# Patient Record
Sex: Female | Born: 1935 | Race: White | Hispanic: No | Marital: Married | Smoking: Never smoker
Health system: Southern US, Community
[De-identification: ages and names within clinical notes are randomized; demographics above are authoritative.]

## PROBLEM LIST (undated history)

## (undated) DIAGNOSIS — J45909 Unspecified asthma, uncomplicated: Secondary | ICD-10-CM

## (undated) DIAGNOSIS — N182 Chronic kidney disease, stage 2 (mild): Secondary | ICD-10-CM

## (undated) DIAGNOSIS — I10 Essential (primary) hypertension: Secondary | ICD-10-CM

## (undated) DIAGNOSIS — I4891 Unspecified atrial fibrillation: Secondary | ICD-10-CM

## (undated) DIAGNOSIS — C801 Malignant (primary) neoplasm, unspecified: Secondary | ICD-10-CM

## (undated) DIAGNOSIS — M8580 Other specified disorders of bone density and structure, unspecified site: Secondary | ICD-10-CM

## (undated) DIAGNOSIS — Z9221 Personal history of antineoplastic chemotherapy: Secondary | ICD-10-CM

## (undated) DIAGNOSIS — I428 Other cardiomyopathies: Secondary | ICD-10-CM

## (undated) DIAGNOSIS — C50919 Malignant neoplasm of unspecified site of unspecified female breast: Secondary | ICD-10-CM

## (undated) DIAGNOSIS — F0390 Unspecified dementia without behavioral disturbance: Secondary | ICD-10-CM

## (undated) HISTORY — DX: Malignant (primary) neoplasm, unspecified: C80.1

## (undated) HISTORY — PX: NASAL SEPTUM SURGERY: SHX37

## (undated) HISTORY — DX: Other specified disorders of bone density and structure, unspecified site: M85.80

## (undated) HISTORY — DX: Unspecified asthma, uncomplicated: J45.909

## (undated) HISTORY — PX: BREAST SURGERY: SHX581

## (undated) HISTORY — DX: Essential (primary) hypertension: I10

## (undated) HISTORY — PX: AUGMENTATION MAMMAPLASTY: SUR837

## (undated) HISTORY — PX: APPENDECTOMY: SHX54

---

## 1994-04-05 HISTORY — PX: MASTECTOMY: SHX3

## 2004-07-21 ENCOUNTER — Other Ambulatory Visit: Admission: RE | Admit: 2004-07-21 | Discharge: 2004-07-21 | Payer: Self-pay | Admitting: Gynecology

## 2004-10-13 ENCOUNTER — Encounter: Admission: RE | Admit: 2004-10-13 | Discharge: 2004-10-13 | Payer: Self-pay | Admitting: Gynecology

## 2005-07-22 ENCOUNTER — Other Ambulatory Visit: Admission: RE | Admit: 2005-07-22 | Discharge: 2005-07-22 | Payer: Self-pay | Admitting: Gynecology

## 2005-11-02 ENCOUNTER — Encounter: Admission: RE | Admit: 2005-11-02 | Discharge: 2005-11-02 | Payer: Self-pay | Admitting: Gynecology

## 2006-07-25 ENCOUNTER — Other Ambulatory Visit: Admission: RE | Admit: 2006-07-25 | Discharge: 2006-07-25 | Payer: Self-pay | Admitting: Gynecology

## 2006-11-04 ENCOUNTER — Encounter: Admission: RE | Admit: 2006-11-04 | Discharge: 2006-11-04 | Payer: Self-pay | Admitting: Gynecology

## 2007-11-07 ENCOUNTER — Encounter: Admission: RE | Admit: 2007-11-07 | Discharge: 2007-11-07 | Payer: Self-pay | Admitting: Obstetrics and Gynecology

## 2008-07-26 ENCOUNTER — Encounter: Payer: Self-pay | Admitting: Women's Health

## 2008-07-26 ENCOUNTER — Ambulatory Visit: Payer: Self-pay | Admitting: Women's Health

## 2008-07-26 ENCOUNTER — Other Ambulatory Visit: Admission: RE | Admit: 2008-07-26 | Discharge: 2008-07-26 | Payer: Self-pay | Admitting: Obstetrics and Gynecology

## 2008-11-07 ENCOUNTER — Encounter: Admission: RE | Admit: 2008-11-07 | Discharge: 2008-11-07 | Payer: Self-pay | Admitting: Obstetrics and Gynecology

## 2009-07-31 ENCOUNTER — Ambulatory Visit: Payer: Self-pay | Admitting: Women's Health

## 2009-09-04 ENCOUNTER — Ambulatory Visit: Payer: Self-pay | Admitting: Gynecology

## 2009-11-10 ENCOUNTER — Encounter: Admission: RE | Admit: 2009-11-10 | Discharge: 2009-11-10 | Payer: Self-pay | Admitting: Obstetrics and Gynecology

## 2010-10-20 ENCOUNTER — Other Ambulatory Visit: Payer: Self-pay | Admitting: Obstetrics and Gynecology

## 2010-10-20 DIAGNOSIS — Z9010 Acquired absence of unspecified breast and nipple: Secondary | ICD-10-CM

## 2010-11-13 ENCOUNTER — Ambulatory Visit
Admission: RE | Admit: 2010-11-13 | Discharge: 2010-11-13 | Disposition: A | Discharge status: Home / Self Care | Payer: PRIVATE HEALTH INSURANCE | Source: Ambulatory Visit | Attending: Obstetrics and Gynecology | Admitting: Obstetrics and Gynecology

## 2010-11-13 DIAGNOSIS — Z9010 Acquired absence of unspecified breast and nipple: Secondary | ICD-10-CM

## 2012-01-25 ENCOUNTER — Other Ambulatory Visit: Payer: Self-pay | Admitting: Obstetrics and Gynecology

## 2012-01-25 DIAGNOSIS — Z1231 Encounter for screening mammogram for malignant neoplasm of breast: Secondary | ICD-10-CM

## 2012-02-29 ENCOUNTER — Ambulatory Visit: Payer: PRIVATE HEALTH INSURANCE

## 2012-03-07 ENCOUNTER — Encounter: Payer: Self-pay | Admitting: Gynecology

## 2012-03-07 DIAGNOSIS — J45909 Unspecified asthma, uncomplicated: Secondary | ICD-10-CM | POA: Insufficient documentation

## 2012-03-07 DIAGNOSIS — C801 Malignant (primary) neoplasm, unspecified: Secondary | ICD-10-CM | POA: Insufficient documentation

## 2012-03-07 DIAGNOSIS — I10 Essential (primary) hypertension: Secondary | ICD-10-CM | POA: Insufficient documentation

## 2012-03-07 DIAGNOSIS — M8580 Other specified disorders of bone density and structure, unspecified site: Secondary | ICD-10-CM | POA: Insufficient documentation

## 2012-03-09 ENCOUNTER — Ambulatory Visit
Admission: RE | Admit: 2012-03-09 | Discharge: 2012-03-09 | Disposition: A | Discharge status: Home / Self Care | Payer: Medicare Other | Source: Ambulatory Visit | Attending: Obstetrics and Gynecology | Admitting: Obstetrics and Gynecology

## 2012-03-09 DIAGNOSIS — Z1231 Encounter for screening mammogram for malignant neoplasm of breast: Secondary | ICD-10-CM

## 2012-03-15 ENCOUNTER — Ambulatory Visit (INDEPENDENT_AMBULATORY_CARE_PROVIDER_SITE_OTHER): Payer: Medicare Other | Admitting: Women's Health

## 2012-03-15 ENCOUNTER — Encounter: Payer: Self-pay | Admitting: Women's Health

## 2012-03-15 ENCOUNTER — Other Ambulatory Visit (HOSPITAL_COMMUNITY)
Admission: RE | Admit: 2012-03-15 | Discharge: 2012-03-15 | Disposition: A | Discharge status: Home / Self Care | Payer: Medicare Other | Source: Ambulatory Visit | Attending: Women's Health | Admitting: Women's Health

## 2012-03-15 ENCOUNTER — Encounter: Payer: PRIVATE HEALTH INSURANCE | Admitting: Obstetrics and Gynecology

## 2012-03-15 VITALS — BP 128/86 | Ht 66.0 in | Wt 121.0 lb

## 2012-03-15 DIAGNOSIS — M8580 Other specified disorders of bone density and structure, unspecified site: Secondary | ICD-10-CM

## 2012-03-15 DIAGNOSIS — Z124 Encounter for screening for malignant neoplasm of cervix: Secondary | ICD-10-CM

## 2012-03-15 DIAGNOSIS — C801 Malignant (primary) neoplasm, unspecified: Secondary | ICD-10-CM

## 2012-03-15 DIAGNOSIS — M899 Disorder of bone, unspecified: Secondary | ICD-10-CM

## 2012-03-15 DIAGNOSIS — Z1151 Encounter for screening for human papillomavirus (HPV): Secondary | ICD-10-CM | POA: Insufficient documentation

## 2012-03-15 NOTE — Patient Instructions (Addendum)

## 2012-03-15 NOTE — Progress Notes (Signed)
Monica Lyons December 29, 1932 [id number]    History:    The patient presents for breast and pelvic exam. History of left breast cancer in 1994 with mastectomy with reconstruction, chemotherapy, radiation and tamoxifen for 4 years. Osteopenic T score -1.9 a left femoral neck, bilateral hip average -1.4. FRAX 11%/2.7%. History of normal Paps and mammograms after 1994. Hypertension-labs and meds primary care. Declines colonoscopy. Has had Pneumovax.  Past medical history, past surgical history, family history and social history were all reviewed and documented in the EPIC chart. Husband had many illnesses/ disabilities and cared for him, he died 03-Feb-2012.    Exam:  Filed Vitals:   03/15/12 1524  BP: 128/86    General appearance:  Normal Head/Neck:  Normal, without cervical or supraclavicular adenopathy. Thyroid:  Symmetrical, normal in size, without palpable masses or nodularity. Respiratory  Effort:  Normal  Auscultation:  Clear without wheezing or rhonchi Cardiovascular  Auscultation:  Regular rate, without rubs, murmurs or gallops  Edema/varicosities:  Not grossly evident Abdominal  Soft,nontender, without masses, guarding or rebound.  Liver/spleen:  No organomegaly noted  Hernia:  None appreciated  Skin  Inspection:  Grossly normal  Palpation:  Grossly normal Neurologic/psychiatric  Orientation:  Normal with appropriate conversation.  Mood/affect:  Normal  Genitourinary    Breasts: Examined lying and sitting.     Right: Without masses, retractions, discharge or axillary adenopathy.     Left: Without masses, retractions, discharge or axillary adenopathy.   Inguinal/mons:  Normal without inguinal adenopathy  External genitalia:  Normal  BUS/Urethra/Skene's glands:  Normal  Bladder:  Normal  Vagina:  Atrophic   Cervix:  Normal  Uterus:   normal in size, shape and contour.  Midline and mobile  Adnexa/parametria:     Rt: Without masses or tenderness.   Lt: Without  masses or tenderness.  Anus and perineum: Normal  Digital rectal exam: Normal sphincter tone without palpated masses or tenderness  Assessment/Plan:  78 y.o. W. WF G2 P2 for breast and pelvic exam.  Left breast cancer/mastectomy/reconstruction 94 with normal mammograms after Osteopenia T score -1.9 left femoral neck 09/2009 Hypertension-primary care  Plan: Repeat DEXA, will schedule. Home safety and fall prevention discussed, calcium rich diet, vitamin D 2000 daily encouraged. Pap, last Pap normal 2010, new screening guidelines reviewed. SBE's, continue annual mammogram, normal mammogram last week. Discussed colonoscopy declines.     Harrington Challenger [facility], 5:39 PM 03/15/2012

## 2012-04-05 DIAGNOSIS — M8580 Other specified disorders of bone density and structure, unspecified site: Secondary | ICD-10-CM

## 2012-04-05 HISTORY — DX: Other specified disorders of bone density and structure, unspecified site: M85.80

## 2012-05-02 ENCOUNTER — Other Ambulatory Visit: Payer: Self-pay | Admitting: Gynecology

## 2012-05-02 ENCOUNTER — Ambulatory Visit (INDEPENDENT_AMBULATORY_CARE_PROVIDER_SITE_OTHER): Payer: Medicare Other

## 2012-05-02 DIAGNOSIS — M8580 Other specified disorders of bone density and structure, unspecified site: Secondary | ICD-10-CM

## 2012-05-02 DIAGNOSIS — M949 Disorder of cartilage, unspecified: Secondary | ICD-10-CM

## 2012-05-03 ENCOUNTER — Encounter: Payer: Self-pay | Admitting: Gynecology

## 2012-05-10 ENCOUNTER — Telehealth: Payer: Self-pay | Admitting: *Deleted

## 2012-05-10 ENCOUNTER — Encounter: Payer: Self-pay | Admitting: Gynecology

## 2012-05-10 ENCOUNTER — Ambulatory Visit (INDEPENDENT_AMBULATORY_CARE_PROVIDER_SITE_OTHER): Payer: Medicare Other | Admitting: Gynecology

## 2012-05-10 DIAGNOSIS — M8580 Other specified disorders of bone density and structure, unspecified site: Secondary | ICD-10-CM

## 2012-05-10 DIAGNOSIS — M899 Disorder of bone, unspecified: Secondary | ICD-10-CM

## 2012-05-10 MED ORDER — ALENDRONATE SODIUM 10 MG PO TABS: 10.0000 mg | ORAL_TABLET | Freq: Every day | ORAL | Status: DC

## 2012-05-10 NOTE — Telephone Encounter (Signed)
Message copied by Aura Camps on Wed May 10, 2012  3:23 PM ------      Message from: Dara Lords      Created: Wed May 10, 2012  3:00 PM       Tell patient that I prescribed the Fosamax 10 mg daily to her pharmacy

## 2012-05-10 NOTE — Progress Notes (Signed)
Patient presents to discuss her recent DEXA. T score -2.0 FRAX 31%/19%. I reviewed in detail her whole DEXA report. I discussed osteopenia and FRAX with elevated fracture risk about the 20%/3% recommended treatment thresholds. I discussed treatment options with her to include bisphosphonates Prolia Evista. She is a breast cancer survivor and does have a history of being very sensitive to new medications. I reviewed Evista and that this may address 2 issues with her history of breast cancer decreasing her risk of recurrence as well as treating the osteoporosis. She was on tamoxifen previously apparently a reaction to this and is leery about starting on Evista.  The patient is very reluctant about starting any medication that has an extended administration such as Prolia or monthly/annual IV bisphosphonates. She is willing to try Fosamax but will do it only on a daily basis so that she can stop it if she has a reaction. If she tolerates this then she would feel comfortable going to the weekly Fosamax dose. I reviewed how to take the medication as well as the side effects and risks to include GERD esophageal cancer osteonecrosis of the jaw and atypical fractures particularly with long-term use. We'll go ahead and start on Fosamax 10 mg daily. We'll see how she does and she will enough she wants comfort to the weekly dose. She has any questions or issues she will call me.

## 2012-05-10 NOTE — Telephone Encounter (Signed)
Pt informed with the below note. 

## 2012-05-10 NOTE — Patient Instructions (Signed)
Start on Fosamax 1 pill daily. After starting this for several weeks if you're doing well we can convert to to the once weekly dose. If you want to continue on the daily dose then let me know and I will refill you.  If you have any side effects or questions let me know.

## 2013-01-29 ENCOUNTER — Other Ambulatory Visit: Payer: Self-pay

## 2013-01-29 DIAGNOSIS — Z9012 Acquired absence of left breast and nipple: Secondary | ICD-10-CM

## 2013-01-29 DIAGNOSIS — Z1231 Encounter for screening mammogram for malignant neoplasm of breast: Secondary | ICD-10-CM

## 2013-03-12 ENCOUNTER — Ambulatory Visit
Admission: RE | Admit: 2013-03-12 | Discharge: 2013-03-12 | Disposition: A | Discharge status: Home / Self Care | Payer: Medicare Other | Source: Ambulatory Visit

## 2013-03-12 DIAGNOSIS — Z9012 Acquired absence of left breast and nipple: Secondary | ICD-10-CM

## 2013-03-12 DIAGNOSIS — Z1231 Encounter for screening mammogram for malignant neoplasm of breast: Secondary | ICD-10-CM

## 2013-04-26 ENCOUNTER — Encounter: Payer: Self-pay | Admitting: Women's Health

## 2013-04-26 ENCOUNTER — Ambulatory Visit (INDEPENDENT_AMBULATORY_CARE_PROVIDER_SITE_OTHER): Payer: Medicare Other | Admitting: Women's Health

## 2013-04-26 VITALS — BP 112/64 | Ht 65.5 in | Wt 121.4 lb

## 2013-04-26 DIAGNOSIS — M8580 Other specified disorders of bone density and structure, unspecified site: Secondary | ICD-10-CM

## 2013-04-26 DIAGNOSIS — M949 Disorder of cartilage, unspecified: Secondary | ICD-10-CM

## 2013-04-26 DIAGNOSIS — M899 Disorder of bone, unspecified: Secondary | ICD-10-CM

## 2013-04-26 NOTE — Patient Instructions (Signed)
Health Recommendations for Postmenopausal Women Respected and ongoing research has looked at the most common causes of death, disability, and poor quality of life in postmenopausal women. The causes include heart disease, diseases of blood vessels, diabetes, depression, cancer, and bone loss (osteoporosis). Many things can be done to help lower the chances of developing these and other common problems: CARDIOVASCULAR DISEASE Heart Disease: A heart attack is a medical emergency. Know the signs and symptoms of a heart attack. Below are things women can do to reduce their risk for heart disease.   Do not smoke. If you smoke, quit.  Aim for a healthy weight. Being overweight causes many preventable deaths. Eat a healthy and balanced diet and drink an adequate amount of liquids.  Get moving. Make a commitment to be more physically active. Aim for 30 minutes of activity on most, if not all days of the week.  Eat for heart health. Choose a diet that is low in saturated fat and cholesterol and eliminate trans fat. Include whole grains, vegetables, and fruits. Read and understand the labels on food containers before buying.  Know your numbers. Ask your caregiver to check your blood pressure, cholesterol (total, HDL, LDL, triglycerides) and blood glucose. Work with your caregiver on improving your entire clinical picture.  High blood pressure. Limit or stop your table salt intake (try salt substitute and food seasonings). Avoid salty foods and drinks. Read labels on food containers before buying. Eating well and exercising can help control high blood pressure. STROKE  Stroke is a medical emergency. Stroke may be the result of a blood clot in a blood vessel in the brain or by a brain hemorrhage (bleeding). Know the signs and symptoms of a stroke. To lower the risk of developing a stroke:  Avoid fatty foods.  Quit smoking.  Control your diabetes, blood pressure, and irregular heart rate. THROMBOPHLEBITIS  (BLOOD CLOT) OF THE LEG  Becoming overweight and leading a stationary lifestyle may also contribute to developing blood clots. Controlling your diet and exercising will help lower the risk of developing blood clots. CANCER SCREENING  Breast Cancer: Take steps to reduce your risk of breast cancer.  You should practice "breast self-awareness." This means understanding the normal appearance and feel of your breasts and should include breast self-examination. Any changes detected, no matter how small, should be reported to your caregiver.  After age 40, you should have a clinical breast exam (CBE) every year.  Starting at age 40, you should consider having a mammogram (breast X-ray) every year.  If you have a family history of breast cancer, talk to your caregiver about genetic screening.  If you are at high risk for breast cancer, talk to your caregiver about having an MRI and a mammogram every year.  Intestinal or Stomach Cancer: Tests to consider are a rectal exam, fecal occult blood, sigmoidoscopy, and colonoscopy. Women who are high risk may need to be screened at an earlier age and more often.  Cervical Cancer:  Beginning at age 30, you should have a Pap test every 3 years as long as the past 3 Pap tests have been normal.  If you have had past treatment for cervical cancer or a condition that could lead to cancer, you need Pap tests and screening for cancer for at least 20 years after your treatment.  If you had a hysterectomy for a problem that was not cancer or a condition that could lead to cancer, then you no longer need Pap tests.    If you are between ages 65 and 70, and you have had normal Pap tests going back 10 years, you no longer need Pap tests.  If Pap tests have been discontinued, risk factors (such as a new sexual partner) need to be reassessed to determine if screening should be resumed.  Some medical problems can increase the chance of getting cervical cancer. In these  cases, your caregiver may recommend more frequent screening and Pap tests.  Uterine Cancer: If you have vaginal bleeding after reaching menopause, you should notify your caregiver.  Ovarian cancer: Other than yearly pelvic exams, there are no reliable tests available to screen for ovarian cancer at this time except for yearly pelvic exams.  Lung Cancer: Yearly chest X-rays can detect lung cancer and should be done on high risk women, such as cigarette smokers and women with chronic lung disease (emphysema).  Skin Cancer: A complete body skin exam should be done at your yearly examination. Avoid overexposure to the sun and ultraviolet light lamps. Use a strong sun block cream when in the sun. All of these things are important in lowering the risk of skin cancer. MENOPAUSE Menopause Symptoms: Hormone therapy products are effective for treating symptoms associated with menopause:  Moderate to severe hot flashes.  Night sweats.  Mood swings.  Headaches.  Tiredness.  Loss of sex drive.  Insomnia.  Other symptoms. Hormone replacement carries certain risks, especially in older women. Women who use or are thinking about using estrogen or estrogen with progestin treatments should discuss that with their caregiver. Your caregiver will help you understand the benefits and risks. The ideal dose of hormone replacement therapy is not known. The Food and Drug Administration (FDA) has concluded that hormone therapy should be used only at the lowest doses and for the shortest amount of time to reach treatment goals.  OSTEOPOROSIS Protecting Against Bone Loss and Preventing Fracture: If you use hormone therapy for prevention of bone loss (osteoporosis), the risks for bone loss must outweigh the risk of the therapy. Ask your caregiver about other medications known to be safe and effective for preventing bone loss and fractures. To guard against bone loss or fractures, the following is recommended:  If  you are less than age 50, take 1000 mg of calcium and at least 600 mg of Vitamin D per day.  If you are greater than age 50 but less than age 70, take 1200 mg of calcium and at least 600 mg of Vitamin D per day.  If you are greater than age 70, take 1200 mg of calcium and at least 800 mg of Vitamin D per day. Smoking and excessive alcohol intake increases the risk of osteoporosis. Eat foods rich in calcium and vitamin D and do weight bearing exercises several times a week as your caregiver suggests. DIABETES Diabetes Melitus: If you have Type I or Type 2 diabetes, you should keep your blood sugar under control with diet, exercise and recommended medication. Avoid too many sweets, starchy and fatty foods. Being overweight can make control more difficult. COGNITION AND MEMORY Cognition and Memory: Menopausal hormone therapy is not recommended for the prevention of cognitive disorders such as Alzheimer's disease or memory loss.  DEPRESSION  Depression may occur at any age, but is common in elderly women. The reasons may be because of physical, medical, social (loneliness), or financial problems and needs. If you are experiencing depression because of medical problems and control of symptoms, talk to your caregiver about this. Physical activity and   exercise may help with mood and sleep. Community and volunteer involvement may help your sense of value and worth. If you have depression and you feel that the problem is getting worse or becoming severe, talk to your caregiver about treatment options that are best for you. ACCIDENTS  Accidents are common and can be serious in the elderly woman. Prepare your house to prevent accidents. Eliminate throw rugs, place hand bars in the bath, shower and toilet areas. Avoid wearing high heeled shoes or walking on wet, snowy, and icy areas. Limit or stop driving if you have vision or hearing problems, or you feel you are unsteady with you movements and  reflexes. HEPATITIS C Hepatitis C is a type of viral infection affecting the liver. It is spread mainly through contact with blood from an infected person. It can be treated, but if left untreated, it can lead to severe liver damage over years. Many people who are infected do not know that the virus is in their blood. If you are a "baby-boomer", it is recommended that you have one screening test for Hepatitis C. IMMUNIZATIONS  Several immunizations are important to consider having during your senior years, including:   Tetanus, diptheria, and pertussis booster shot.  Influenza every year before the flu season begins.  Pneumonia vaccine.  Shingles vaccine.  Others as indicated based on your specific needs. Talk to your caregiver about these. Document Released: 05/14/2005 Document Revised: 03/08/2012 Document Reviewed: 01/08/2008 Exit[employer] Patient Information 2014 [employer].  

## 2013-04-26 NOTE — Progress Notes (Signed)
Monica Lyons 01-22-1933 [id number]    History:    Presents for breast and pelvic exam. Postmenopausal/no bleeding/no HRT. 1994 left breast cancer mastectomy with implant radiation, chemotherapy, tamoxifen for 4 years. Normal mammograms after. Normal Pap history. Refused colonoscopy. 04/2012 DEXA T score -2 FRAX 31%/19%, had tried Fosamax 10 mg daily increased reflux, joint pain, declines other treatment. States had a traumatic fall several months ago with no fracture. Hypertension primary care manages. Has had Pneumovax, cannot take zostavac due to allergy.  Past medical history, past surgical history, family history and social history were all reviewed and documented in the EPIC chart. Husband died 01-14-2012, after prolonged illness.  Has a new dog Henry.   Exam:  Filed Vitals:   04/26/13 1043  BP: 112/64    General appearance:  Normal Thyroid:  Symmetrical, normal in size, without palpable masses or nodularity. Respiratory  Auscultation:  Clear without wheezing or rhonchi Cardiovascular  Auscultation:  Regular rate, without rubs, murmurs or gallops  Edema/varicosities:  Not grossly evident Abdominal  Soft,nontender, without masses, guarding or rebound.  Liver/spleen:  No organomegaly noted  Hernia:  None appreciated  Skin  Inspection:  Grossly normal   Breasts: Examined lying and sitting.     Right: Without masses, retractions, discharge or axillary adenopathy.     Left: Mastectomy with saline implant Gentitourinary   Inguinal/mons:  Normal without inguinal adenopathy  External genitalia:  Normal  BUS/Urethra/Skene's glands:  Normal  Vagina:  Atrophic/not sexually active  Cervix:  Normal  Uterus:   normal in size, shape and contour.  Midline and mobile  Adnexa/parametria:     Rt: Without masses or tenderness.   Lt: Without masses or tenderness.  Anus and perineum: Normal  Digital rectal exam: Normal sphincter tone without palpated masses or  tenderness  Assessment/Plan:  80 y.o.WWF G2P2  for breast and pelvic exam.  Postmenopausal/no HRT/no bleeding Osteopenia with elevated FRAX risk. Hypertension-primary care manages labs and meds 1994 left breast cancer mastectomy/chemotherapy/radiation  Plan: Long discussion on osteopenia and an elevated FRAX risk. Declines further treatment. States had a traumatic fall with no fractures. Reviewed importance of home safety, fall prevention. SBE's, continue annual mammogram, calcium rich diet, vitamin D 2000 daily encouraged. Active lifestyle will continue. Importance of screening colonoscopy reviewed, declines.    Huel Cote [facility], 12:42 PM 04/26/2013

## 2014-02-01 ENCOUNTER — Other Ambulatory Visit: Payer: Self-pay

## 2014-02-01 DIAGNOSIS — Z9012 Acquired absence of left breast and nipple: Secondary | ICD-10-CM

## 2014-02-01 DIAGNOSIS — Z1231 Encounter for screening mammogram for malignant neoplasm of breast: Secondary | ICD-10-CM

## 2014-02-04 ENCOUNTER — Encounter: Payer: Self-pay | Admitting: Women's Health

## 2014-03-14 ENCOUNTER — Ambulatory Visit
Admission: RE | Admit: 2014-03-14 | Discharge: 2014-03-14 | Disposition: A | Discharge status: Home / Self Care | Payer: Medicare Other | Source: Ambulatory Visit

## 2014-03-14 DIAGNOSIS — Z1231 Encounter for screening mammogram for malignant neoplasm of breast: Secondary | ICD-10-CM

## 2014-03-14 DIAGNOSIS — Z9012 Acquired absence of left breast and nipple: Secondary | ICD-10-CM

## 2014-04-30 ENCOUNTER — Encounter: Payer: Self-pay | Admitting: Women's Health

## 2014-04-30 ENCOUNTER — Ambulatory Visit (INDEPENDENT_AMBULATORY_CARE_PROVIDER_SITE_OTHER): Payer: Medicare Other | Admitting: Women's Health

## 2014-04-30 VITALS — BP 140/74 | Ht 65.51 in | Wt 127.0 lb

## 2014-04-30 DIAGNOSIS — M810 Age-related osteoporosis without current pathological fracture: Secondary | ICD-10-CM | POA: Diagnosis not present

## 2014-04-30 DIAGNOSIS — Z01419 Encounter for gynecological examination (general) (routine) without abnormal findings: Secondary | ICD-10-CM

## 2014-04-30 NOTE — Patient Instructions (Signed)

## 2014-04-30 NOTE — Progress Notes (Signed)
Monica Lyons [birth year] [id number]    History:    Presents for annual exam.  Postmenopausal/no bleeding/no HRT. 1996 left mastectomy with reconstruction. Normal mammogram after. 04/2012 osteopenia T score -2 FRAX 31%/19% was started on Fosamax 10 mg daily did not tolerate well declines further treatment reports difficulty taking any medication. Had a traumatic fall one year ago torn ligaments but no fractures. Normal Pap history. Not sexually active/widowed 2 years. Current on vaccines, unable to take shingles vaccine - allergy.  Past medical history, past surgical history, family history and social history were all reviewed and documented in the EPIC chart. 2 children both doing well.  ROS:  A ROS was performed and pertinent positives and negatives are included.  Exam:  Filed Vitals:   04/30/14 1502  BP: 140/74    General appearance:  Normal Thyroid:  Symmetrical, normal in size, without palpable masses or nodularity. Respiratory  Auscultation:  Clear without wheezing or rhonchi Cardiovascular  Auscultation:  Regular rate, without rubs, murmurs or gallops  Edema/varicosities:  Not grossly evident Abdominal  Soft,nontender, without masses, guarding or rebound.  Liver/spleen:  No organomegaly noted  Hernia:  None appreciated  Skin  Inspection:  Grossly normal   Breasts: Examined lying and sitting.     Right: Without masses, retractions, discharge or axillary adenopathy.     Left: Without masses, retractions, discharge or axillary adenopathy. Gentitourinary   Inguinal/mons:  Normal without inguinal adenopathy  External genitalia:  Normal  BUS/Urethra/Skene's glands:  Normal  Vagina:  Normal  Cervix:  Normal  Uterus:  normal in size, shape and contour.  Midline and mobile  Adnexa/parametria:     Rt: Without masses or tenderness.   Lt: Without masses or tenderness.  Anus and perineum: Normal  Digital rectal exam: Normal sphincter tone without palpated masses or  tenderness  Assessment/Plan:  81 y.o. Monica Lyons G2P2 for annual exam with no complaints.  Postmenopausal/no HRT/no bleeding Osteopenia without elevated FRAX declines further treatment Hypertension-primary care manages labs and meds 1996  Left breast cancer normal mammograms after  Plan: SBE's, continue annual screening mammogram, calcium rich diet, vitamin D 2000 daily encouraged. States had low end of normal vitamin D primary care. Reviewed  home safety, fall prevention and importance of regular exercise. Paps normal with negative HR HPV 2013, new screening guidelines reviewed.  Monica Lyons, 5:36 PM 04/30/2014

## 2014-05-01 LAB — URINALYSIS W MICROSCOPIC + REFLEX CULTURE
BACTERIA UA: NONE SEEN
BILIRUBIN URINE: NEGATIVE
CASTS: NONE SEEN
CRYSTALS: NONE SEEN
Glucose, UA: NEGATIVE mg/dL
HGB URINE DIPSTICK: NEGATIVE
KETONES UR: NEGATIVE mg/dL
LEUKOCYTES UA: NEGATIVE
Nitrite: NEGATIVE
PROTEIN: NEGATIVE mg/dL
SPECIFIC GRAVITY, URINE: 1.02 (ref 1.005–1.030)
SQUAMOUS EPITHELIAL / LPF: NONE SEEN
UROBILINOGEN UA: 0.2 mg/dL (ref 0.0–1.0)
pH: 5.5 (ref 5.0–8.0)

## 2015-02-13 ENCOUNTER — Other Ambulatory Visit: Payer: Self-pay

## 2015-02-13 DIAGNOSIS — Z9012 Acquired absence of left breast and nipple: Secondary | ICD-10-CM

## 2015-02-13 DIAGNOSIS — Z1231 Encounter for screening mammogram for malignant neoplasm of breast: Secondary | ICD-10-CM

## 2015-03-19 ENCOUNTER — Ambulatory Visit: Payer: Medicare Other

## 2015-04-15 ENCOUNTER — Ambulatory Visit: Payer: Medicare Other

## 2015-05-02 ENCOUNTER — Ambulatory Visit
Admission: RE | Admit: 2015-05-02 | Discharge: 2015-05-02 | Disposition: A | Discharge status: Home / Self Care | Payer: Medicare Other | Source: Ambulatory Visit

## 2015-05-02 DIAGNOSIS — Z9012 Acquired absence of left breast and nipple: Secondary | ICD-10-CM

## 2015-05-02 DIAGNOSIS — Z1231 Encounter for screening mammogram for malignant neoplasm of breast: Secondary | ICD-10-CM

## 2015-05-06 ENCOUNTER — Encounter: Payer: Self-pay | Admitting: Women's Health

## 2015-05-06 ENCOUNTER — Ambulatory Visit (INDEPENDENT_AMBULATORY_CARE_PROVIDER_SITE_OTHER): Payer: Medicare Other | Admitting: Women's Health

## 2015-05-06 VITALS — BP 132/80 | Ht 65.0 in | Wt 126.0 lb

## 2015-05-06 DIAGNOSIS — Z01419 Encounter for gynecological examination (general) (routine) without abnormal findings: Secondary | ICD-10-CM

## 2015-05-06 DIAGNOSIS — Z8739 Personal history of other diseases of the musculoskeletal system and connective tissue: Secondary | ICD-10-CM | POA: Diagnosis not present

## 2015-05-06 DIAGNOSIS — Z853 Personal history of malignant neoplasm of breast: Secondary | ICD-10-CM | POA: Diagnosis not present

## 2015-05-06 NOTE — Patient Instructions (Signed)
DASH Eating Plan DASH stands for "Dietary Approaches to Stop Hypertension." The DASH eating plan is a healthy eating plan that has been shown to reduce high blood pressure (hypertension). Additional health benefits may include reducing the risk of type 2 diabetes mellitus, heart disease, and stroke. The DASH eating plan may also help with weight loss. WHAT DO I NEED TO KNOW ABOUT THE DASH EATING PLAN? For the DASH eating plan, you will follow these general guidelines:  Choose foods with a percent daily value for sodium of less than 5% (as listed on the food label).  Use salt-free seasonings or herbs instead of table salt or sea salt.  Check with your health care provider or pharmacist before using salt substitutes.  Eat lower-sodium products, often labeled as "lower sodium" or "no salt added."  Eat fresh foods.  Eat more vegetables, fruits, and low-fat dairy products.  Choose whole grains. Look for the word "whole" as the first word in the ingredient list.  Choose fish and skinless chicken or [place] more often than red meat. Limit fish, poultry, and meat to 6 oz (170 g) each day.  Limit sweets, desserts, sugars, and sugary drinks.  Choose heart-healthy fats.  Limit cheese to 1 oz (28 g) per day.  Eat more home-cooked food and less restaurant, buffet, and fast food.  Limit fried foods.  Cook foods using methods other than frying.  Limit canned vegetables. If you do use them, rinse them well to decrease the sodium.  When eating at a restaurant, ask that your food be prepared with less salt, or no salt if possible. WHAT FOODS CAN I EAT? Seek help from a dietitian for individual calorie needs. Grains Whole grain or whole wheat bread. Brown rice. Whole grain or whole wheat pasta. Quinoa, bulgur, and whole grain cereals. Low-sodium cereals. Corn or whole wheat flour tortillas. Whole grain cornbread. Whole grain crackers. Low-sodium crackers. Vegetables Fresh or frozen vegetables  (raw, steamed, roasted, or grilled). Low-sodium or reduced-sodium tomato and vegetable juices. Low-sodium or reduced-sodium tomato sauce and paste. Low-sodium or reduced-sodium canned vegetables.  Fruits All fresh, canned (in natural juice), or frozen fruits. Meat and Other Protein Products Ground beef (85% or leaner), grass-fed beef, or beef trimmed of fat. Skinless chicken or [place]. Ground chicken or [place]. Pork trimmed of fat. All fish and seafood. Eggs. Dried beans, peas, or lentils. Unsalted nuts and seeds. Unsalted canned beans. Dairy Low-fat dairy products, such as skim or 1% milk, 2% or reduced-fat cheeses, low-fat ricotta or cottage cheese, or plain low-fat yogurt. Low-sodium or reduced-sodium cheeses. Fats and Oils Tub margarines without trans fats. Light or reduced-fat mayonnaise and salad dressings (reduced sodium). Avocado. Safflower, olive, or canola oils. Natural peanut or almond butter. Other Unsalted popcorn and pretzels. The items listed above may not be a complete list of recommended foods or beverages. Contact your dietitian for more options. WHAT FOODS ARE NOT RECOMMENDED? Grains White bread. White pasta. White rice. Refined cornbread. Bagels and croissants. Crackers that contain trans fat. Vegetables Creamed or fried vegetables. Vegetables in a cheese sauce. Regular canned vegetables. Regular canned tomato sauce and paste. Regular tomato and vegetable juices. Fruits Dried fruits. Canned fruit in light or heavy syrup. Fruit juice. Meat and Other Protein Products Fatty cuts of meat. Ribs, chicken wings, bacon, sausage, bologna, salami, chitterlings, fatback, hot dogs, bratwurst, and packaged luncheon meats. Salted nuts and seeds. Canned beans with salt. Dairy Whole or 2% milk, cream, half-and-half, and cream cheese. Whole-fat or sweetened yogurt. Full-fat  cheeses or blue cheese. Nondairy creamers and whipped toppings. Processed cheese, cheese spreads, or cheese  curds. Condiments Onion and garlic salt, seasoned salt, table salt, and sea salt. Canned and packaged gravies. Worcestershire sauce. Tartar sauce. Barbecue sauce. Teriyaki sauce. Soy sauce, including reduced sodium. Steak sauce. Fish sauce. Oyster sauce. Cocktail sauce. Horseradish. Ketchup and mustard. Meat flavorings and tenderizers. Bouillon cubes. Hot sauce. Tabasco sauce. Marinades. Taco seasonings. Relishes. Fats and Oils Butter, stick margarine, lard, shortening, ghee, and bacon fat. Coconut, palm kernel, or palm oils. Regular salad dressings. Other Pickles and olives. Salted popcorn and pretzels. The items listed above may not be a complete list of foods and beverages to avoid. Contact your dietitian for more information. WHERE CAN I FIND MORE INFORMATION? National Heart, Lung, and Blood Institute: www.nhlbi.nih.gov/health/health-topics/topics/dash/   This information is not intended to replace advice given to you by your health care provider. Make sure you discuss any questions you have with your health care provider.   Document Released: 03/11/2011 Document Revised: 04/12/2014 Document Reviewed: 01/24/2013 Elsevier Interactive Patient Education 2016 [employer] Menopause is a normal process in which your reproductive ability comes to an end. This process happens gradually over a span of months to years, usually between the ages of 48 and 55. Menopause is complete when you have missed 12 consecutive menstrual periods. It is important to talk with your health care provider about some of the most common conditions that affect postmenopausal women, such as heart disease, cancer, and bone loss (osteoporosis). Adopting a healthy lifestyle and getting preventive care can help to promote your health and wellness. Those actions can also lower your chances of developing some of these common conditions. WHAT SHOULD I KNOW ABOUT MENOPAUSE? During menopause, you may experience a number of symptoms,  such as:  Moderate-to-severe hot flashes.  Night sweats.  Decrease in sex drive.  Mood swings.  Headaches.  Tiredness.  Irritability.  Memory problems.  Insomnia. Choosing to treat or not to treat menopausal changes is an individual decision that you make with your health care provider. WHAT SHOULD I KNOW ABOUT HORMONE REPLACEMENT THERAPY AND SUPPLEMENTS? Hormone therapy products are effective for treating symptoms that are associated with menopause, such as hot flashes and night sweats. Hormone replacement carries certain risks, especially as you become older. If you are thinking about using estrogen or estrogen with progestin treatments, discuss the benefits and risks with your health care provider. WHAT SHOULD I KNOW ABOUT HEART DISEASE AND STROKE? Heart disease, heart attack, and stroke become more likely as you age. This may be due, in part, to the hormonal changes that your body experiences during menopause. These can affect how your body processes dietary fats, triglycerides, and cholesterol. Heart attack and stroke are both medical emergencies. There are many things that you can do to help prevent heart disease and stroke:  Have your blood pressure checked at least every 1-2 years. High blood pressure causes heart disease and increases the risk of stroke.  If you are 55-79 years old, ask your health care provider if you should take aspirin to prevent a heart attack or a stroke.  Do not use any tobacco products, including cigarettes, chewing tobacco, or electronic cigarettes. If you need help quitting, ask your health care provider.  It is important to eat a healthy diet and maintain a healthy weight.  Be sure to include plenty of vegetables, fruits, low-fat dairy products, and lean protein.  Avoid eating foods that are high in solid fats, added   sugars, or salt (sodium).  Get regular exercise. This is one of the most important things that you can do for your  health.  Try to exercise for at least 150 minutes each week. The type of exercise that you do should increase your heart rate and make you sweat. This is known as moderate-intensity exercise.  Try to do strengthening exercises at least twice each week. Do these in addition to the moderate-intensity exercise.  Know your numbers.Ask your health care provider to check your cholesterol and your blood glucose. Continue to have your blood tested as directed by your health care provider. WHAT SHOULD I KNOW ABOUT CANCER SCREENING? There are several types of cancer. Take the following steps to reduce your risk and to catch any cancer development as early as possible. Breast Cancer  Practice breast self-awareness.  This means understanding how your breasts normally appear and feel.  It also means doing regular breast self-exams. Let your health care provider know about any changes, no matter how small.  If you are 69 or older, have a clinician do a breast exam (clinical breast exam or CBE) every year. Depending on your age, family history, and medical history, it may be recommended that you also have a yearly breast X-ray (mammogram).  If you have a family history of breast cancer, talk with your health care provider about genetic screening.  If you are at high risk for breast cancer, talk with your health care provider about having an MRI and a mammogram every year.  Breast cancer (BRCA) gene test is recommended for women who have family members with BRCA-related cancers. Results of the assessment will determine the need for genetic counseling and BRCA1 and for BRCA2 testing. BRCA-related cancers include these types:  Breast. This occurs in males or females.  Ovarian.  Tubal. This may also be called fallopian tube cancer.  Cancer of the abdominal or pelvic lining (peritoneal cancer).  Prostate.  Pancreatic. Cervical, Uterine, and Ovarian Cancer Your health care provider may recommend  that you be screened regularly for cancer of the pelvic organs. These include your ovaries, uterus, and vagina. This screening involves a pelvic exam, which includes checking for microscopic changes to the surface of your cervix (Pap test).  For women ages 21-65, health care providers may recommend a pelvic exam and a Pap test every three years. For women ages 96-65, they may recommend the Pap test and pelvic exam, combined with testing for human papilloma virus (HPV), every five years. Some types of HPV increase your risk of cervical cancer. Testing for HPV may also be done on women of any age who have unclear Pap test results.  Other health care providers may not recommend any screening for nonpregnant women who are considered low risk for pelvic cancer and have no symptoms. Ask your health care provider if a screening pelvic exam is right for you.  If you have had past treatment for cervical cancer or a condition that could lead to cancer, you need Pap tests and screening for cancer for at least 20 years after your treatment. If Pap tests have been discontinued for you, your risk factors (such as having a new sexual partner) need to be reassessed to determine if you should start having screenings again. Some women have medical problems that increase the chance of getting cervical cancer. In these cases, your health care provider may recommend that you have screening and Pap tests more often.  If you have a family history of uterine  cancer or ovarian cancer, talk with your health care provider about genetic screening.  If you have vaginal bleeding after reaching menopause, tell your health care provider.  There are currently no reliable tests available to screen for ovarian cancer. Lung Cancer Lung cancer screening is recommended for adults 35-29 years old who are at high risk for lung cancer because of a history of smoking. A yearly low-dose CT scan of the lungs is recommended if you:  Currently  smoke.  Have a history of at least 30 pack-years of smoking and you currently smoke or have quit within the past 15 years. A pack-year is smoking an average of one pack of cigarettes per day for one year. Yearly screening should:  Continue until it has been 15 years since you quit.  Stop if you develop a health problem that would prevent you from having lung cancer treatment. Colorectal Cancer  This type of cancer can be detected and can often be prevented.  Routine colorectal cancer screening usually begins at age 43 and continues through age 48.  If you have risk factors for colon cancer, your health care provider may recommend that you be screened at an earlier age.  If you have a family history of colorectal cancer, talk with your health care provider about genetic screening.  Your health care provider may also recommend using home test kits to check for hidden blood in your stool.  A small camera at the end of a tube can be used to examine your colon directly (sigmoidoscopy or colonoscopy). This is done to check for the earliest forms of colorectal cancer.  Direct examination of the colon should be repeated every 5-10 years until age 44. However, if early forms of precancerous polyps or small growths are found or if you have a family history or genetic risk for colorectal cancer, you may need to be screened more often. Skin Cancer  Check your skin from head to toe regularly.  Monitor any moles. Be sure to tell your health care provider:  About any new moles or changes in moles, especially if there is a change in a mole's shape or color.  If you have a mole that is larger than the size of a pencil eraser.  If any of your family members has a history of skin cancer, especially at a young age, talk with your health care provider about genetic screening.  Always use sunscreen. Apply sunscreen liberally and repeatedly throughout the day.  Whenever you are outside, protect  yourself by wearing long sleeves, pants, a wide-brimmed hat, and sunglasses. WHAT SHOULD I KNOW ABOUT OSTEOPOROSIS? Osteoporosis is a condition in which bone destruction happens more quickly than new bone creation. After menopause, you may be at an increased risk for osteoporosis. To help prevent osteoporosis or the bone fractures that can happen because of osteoporosis, the following is recommended:  If you are 27-33 years old, get at least 1,000 mg of calcium and at least 600 mg of vitamin D per day.  If you are older than age 69 but younger than age 61, get at least 1,200 mg of calcium and at least 600 mg of vitamin D per day.  If you are older than age 83, get at least 1,200 mg of calcium and at least 800 mg of vitamin D per day. Smoking and excessive alcohol intake increase the risk of osteoporosis. Eat foods that are rich in calcium and vitamin D, and do weight-bearing exercises several times each week as  directed by your health care provider. WHAT SHOULD I KNOW ABOUT HOW MENOPAUSE AFFECTS [place]? Depression may occur at any age, but it is more common as you become older. Common symptoms of depression include:  Low or sad mood.  Changes in sleep patterns.  Changes in appetite or eating patterns.  Feeling an overall lack of motivation or enjoyment of activities that you previously enjoyed.  Frequent crying spells. Talk with your health care provider if you think that you are experiencing depression. WHAT SHOULD I KNOW ABOUT IMMUNIZATIONS? It is important that you get and maintain your immunizations. These include:  Tetanus, diphtheria, and pertussis (Tdap) booster vaccine.  Influenza every year before the flu season begins.  Pneumonia vaccine.  Shingles vaccine. Your health care provider may also recommend other immunizations.   This information is not intended to replace advice given to you by your health care provider. Make sure you discuss any questions you have  with your health care provider.   Document Released: 05/14/2005 Document Revised: 04/12/2014 Document Reviewed: 11/22/2013 Elsevier Interactive Patient Education [employer].

## 2015-05-06 NOTE — Progress Notes (Signed)
Monica Lyons [birth year] [id number]    History:    Presents for breast and pelvic exam. Normal Pap history. 1996 left breast cancer mastectomy with reconstruction and chemotherapy. Hypertension primary care manages. 04/2012 T score -2 FRAX 31%/19% was on Fosamax did not tolerate and declines further treatment. Has had a traumatic fall with no fracture. Has had pneumonia vaccine, unable to take shingles vaccine.  Past medical history, past surgical history, family history and social history were all reviewed and documented in the EPIC chart. Husband died 3 years ago. Has 2 children. Sings in a choir. Had been in a choir that toured [place] when younger.  ROS:  A ROS was performed and pertinent positives and negatives are included.  Exam:  Filed Vitals:   05/06/15 1409  BP: 132/80    General appearance:  Normal Thyroid:  Symmetrical, normal in size, without palpable masses or nodularity. Respiratory  Auscultation:  Clear without wheezing or rhonchi Cardiovascular  Auscultation:  Regular rate, without rubs, murmurs or gallops  Edema/varicosities:  Not grossly evident Abdominal  Soft,nontender, without masses, guarding or rebound.  Liver/spleen:  No organomegaly noted  Hernia:  None appreciated  Skin  Inspection:  Grossly normal   Breasts: Examined lying and sitting.     Right: Without masses, retractions, discharge or axillary adenopathy.     Left: Mastectomy with reconstruction Gentitourinary   Inguinal/mons:  Normal without inguinal adenopathy  External genitalia:  Normal  BUS/Urethra/Skene's glands:  Normal  Vagina:  Normal  Cervix:  Normal  Uterus:  normal in size, shape and contour.  Midline and mobile  Adnexa/parametria:     Rt: Without masses or tenderness.   Lt: Without masses or tenderness.  Anus and perineum: Normal  Digital rectal exam: Normal sphincter tone without palpated masses or tenderness  Assessment/Plan:  82 y.o.  W WF G2 P2 for breast and pelvic  exam with no complaints.  Postmenopausal/no HRT/no bleeding 1996 left breast cancer mastectomy with reconstruction Hypertension-primary care manages labs and meds Osteopenia with elevated FRAX declines treatment  Numerous allergies/asthma  Plan: Continue annual screening 3-D mammogram, SBE's, report changes. Continue active lifestyle of exercise and activity. Home safety, fall prevention and importance of weightbearing exercise reviewed.     Huel Cote [facility], 5:09 PM 05/06/2015

## 2016-04-08 ENCOUNTER — Other Ambulatory Visit: Payer: Self-pay | Admitting: Internal Medicine

## 2016-04-08 DIAGNOSIS — Z1231 Encounter for screening mammogram for malignant neoplasm of breast: Secondary | ICD-10-CM

## 2016-04-08 DIAGNOSIS — Z9012 Acquired absence of left breast and nipple: Secondary | ICD-10-CM

## 2016-05-06 ENCOUNTER — Ambulatory Visit: Payer: Medicare Other

## 2016-05-10 ENCOUNTER — Telehealth: Payer: Self-pay | Admitting: *Deleted

## 2016-05-10 NOTE — Telephone Encounter (Signed)
Pt called to get your opinion regarding scheduling annual exam this year, pt is 83 years old. You don't prescribe any medication for her, she asked if its needed to come in?  she has mammogram scheduled on 06/03/16, history of breast cancer.  Please advise

## 2016-05-11 ENCOUNTER — Encounter: Payer: Medicare Other | Admitting: Women's Health

## 2016-05-11 NOTE — Telephone Encounter (Signed)
TC, will continue to have annual exam to primary care and annual mammogram.

## 2016-06-03 ENCOUNTER — Ambulatory Visit: Payer: Medicare Other

## 2016-06-22 ENCOUNTER — Ambulatory Visit
Admission: RE | Admit: 2016-06-22 | Discharge: 2016-06-22 | Disposition: A | Discharge status: Home / Self Care | Payer: Medicare Other | Source: Ambulatory Visit | Attending: Internal Medicine | Admitting: Internal Medicine

## 2016-06-22 ENCOUNTER — Other Ambulatory Visit: Payer: Self-pay | Admitting: Cardiology

## 2016-06-22 DIAGNOSIS — Z9012 Acquired absence of left breast and nipple: Secondary | ICD-10-CM

## 2016-06-22 DIAGNOSIS — Z1231 Encounter for screening mammogram for malignant neoplasm of breast: Secondary | ICD-10-CM

## 2016-06-22 HISTORY — DX: Malignant neoplasm of unspecified site of unspecified female breast: C50.919

## 2016-06-22 HISTORY — DX: Personal history of antineoplastic chemotherapy: Z92.21

## 2017-06-06 ENCOUNTER — Other Ambulatory Visit: Payer: Self-pay | Admitting: Internal Medicine

## 2017-06-06 ENCOUNTER — Other Ambulatory Visit: Payer: Self-pay | Admitting: Cardiology

## 2017-06-06 DIAGNOSIS — Z1231 Encounter for screening mammogram for malignant neoplasm of breast: Secondary | ICD-10-CM

## 2017-06-17 ENCOUNTER — Encounter: Payer: Self-pay | Admitting: Cardiovascular Disease

## 2017-06-21 ENCOUNTER — Encounter (INDEPENDENT_AMBULATORY_CARE_PROVIDER_SITE_OTHER): Payer: Self-pay

## 2017-06-21 ENCOUNTER — Ambulatory Visit (INDEPENDENT_AMBULATORY_CARE_PROVIDER_SITE_OTHER): Payer: Medicare Other | Admitting: Cardiovascular Disease

## 2017-06-21 ENCOUNTER — Encounter: Payer: Self-pay | Admitting: Cardiovascular Disease

## 2017-06-21 VITALS — BP 130/84 | HR 120 | Ht 65.0 in | Wt 133.0 lb

## 2017-06-21 DIAGNOSIS — I5041 Acute combined systolic (congestive) and diastolic (congestive) heart failure: Secondary | ICD-10-CM

## 2017-06-21 DIAGNOSIS — I480 Paroxysmal atrial fibrillation: Secondary | ICD-10-CM | POA: Insufficient documentation

## 2017-06-21 DIAGNOSIS — I2721 Secondary pulmonary arterial hypertension: Secondary | ICD-10-CM | POA: Insufficient documentation

## 2017-06-21 MED ORDER — APIXABAN 5 MG PO TABS: 5.0000 mg | ORAL_TABLET | Freq: Two times a day (BID) | ORAL | 6 refills | Status: DC

## 2017-06-21 MED ORDER — CARVEDILOL 6.25 MG PO TABS: 6.2500 mg | ORAL_TABLET | Freq: Two times a day (BID) | ORAL | 3 refills | Status: DC

## 2017-06-21 NOTE — Assessment & Plan Note (Signed)
Monica Lyons was referred by Roque Cash for heart failure. Her echo shows normal LV function.her EKG today shows A. Fib with RVR, right bundle branch block with a ventricular response of 120. I'm going to begin her on low dose carvedilol and start Eliquis  5 mg twice a day.

## 2017-06-21 NOTE — Addendum Note (Signed)
Addended by: Therisa Doyne on: 06/21/2017 02:33 PM   Modules accepted: Orders

## 2017-06-21 NOTE — Assessment & Plan Note (Signed)
History of essential hypertension with blood pressure measures had 130/84. She is on losartan.

## 2017-06-21 NOTE — Progress Notes (Signed)
06/21/2017 Monica Lyons   Mar 22, 1933  [id number]  Primary Physician Elmer Bales, MD Primary Cardiologist: Lorretta Harp MD Garret Reddish, [place], [place]  HPI:  Monica Lyons is a 84 y.o. . Appearing widowed Caucasian female mother of 2, grandmother of 7 grandchildren as accompanied by her daughter and previous use was referred by Roque Cash for cardiovascular evaluation treatment of congestive heart failure. She had a 2-D echocardiogram performed 02/14/17 which was essentially normal with a RV end-diastolic pressure of 40 mmHg. She's had increasing dyspnea and lower extreme edema over the last several months. She's never had a heart attack or stroke. A recent 2-D echo performed several days ago showed similar findings as the previous one. She was prescribed carvedilol which she did not fill as well as low-dose Lasix which has improved her symptoms and lower shin edema. BNP was measured at 900. She is in A. Fib with RVR today.    Current Meds  Medication Sig  . albuterol (PROVENTIL HFA;VENTOLIN HFA) 108 (90 BASE) MCG/ACT inhaler Inhale 2 puffs into the lungs every 6 (six) hours as needed.  . Cholecalciferol (VITAMIN D3) 2000 UNITS TABS Take 1 tablet by mouth daily.   . diphenhydrAMINE (BENADRYL) 25 MG tablet Take 25 mg by mouth as needed.  Marland Kitchen EPINEPHrine (EPIPEN JR) 0.15 MG/0.3ML injection Inject 0.15 mg into the muscle as needed.  Marland Kitchen EPINEPHRINE, ANAPHYLAXIS THERAPY AGENTS, 0.3 mLs as needed.  . fluticasone (FLOVENT HFA) 110 MCG/ACT inhaler Inhale 1 puff into the lungs 2 (two) times daily.  Marland Kitchen loperamide (IMODIUM A-D) 2 MG tablet Take 2 mg by mouth as needed.  Marland Kitchen losartan (COZAAR) 50 MG tablet Take 50 mg by mouth 2 (two) times daily.  . mometasone (NASONEX) 50 MCG/ACT nasal spray Place 2 sprays into the nose daily.  Marland Kitchen Respiratory Therapy Supplies (NEBULIZER) DEVI by Does not apply route as directed.  . [DISCONTINUED] azelastine (ASTELIN) 137 MCG/SPRAY nasal spray Place 1  spray into the nose 2 (two) times daily. Use in each nostril as directed     Allergies  Allergen Reactions  . Bee Venom Anaphylaxis  . Nutmeg Oil (Myristica Oil) Swelling  . Zoster Vaccine Live Other (See Comments)    Mycin family  . Amlodipine Besylate     Muscle cramps  . Aspirin   . Chlorthalidone     Hyponatremia   . Diltiazem Hcl     Syncope  . Erythromycin   . Morphine And Related   . Sulfa Antibiotics   . Symbicort [Budesonide-Formoterol Fumarate]   . Talc   . Tape     Social History   Socioeconomic History  . Marital status: Married    Spouse name: Not on file  . Number of children: Not on file  . Years of education: Not on file  . Highest education level: Not on file  Social Needs  . Financial resource strain: Not on file  . Food insecurity - worry: Not on file  . Food insecurity - inability: Not on file  . Transportation needs - medical: Not on file  . Transportation needs - non-medical: Not on file  Occupational History  . Not on file  Tobacco Use  . Smoking status: Never Smoker  . Smokeless tobacco: Never Used  Substance and Sexual Activity  . Alcohol use: No  . Drug use: No  . Sexual activity: Not Currently    Birth control/protection: Post-menopausal  Other Topics Concern  . Not on file  Social History Narrative  . Not on file     Review of Systems: General: negative for chills, fever, night sweats or weight changes.  Cardiovascular: negative for chest pain, dyspnea on exertion, edema, orthopnea, palpitations, paroxysmal nocturnal dyspnea or shortness of breath Dermatological: negative for rash Respiratory: negative for cough or wheezing Urologic: negative for hematuria Abdominal: negative for nausea, vomiting, diarrhea, bright red blood per rectum, melena, or hematemesis Neurologic: negative for visual changes, syncope, or dizziness All other systems reviewed and are otherwise negative except as noted above.    Blood pressure 130/84,  pulse (!) 120, height 5\' 5"  (1.651 m), weight 133 lb (60.3 kg).  General appearance: alert and no distress Neck: no adenopathy, no carotid bruit, no JVD, supple, symmetrical, trachea midline and thyroid not enlarged, symmetric, no tenderness/mass/nodules Lungs: clear to auscultation bilaterally Heart: irregularly irregular rhythm Extremities: 2+ pitting edema bilaterally Pulses: 2+ and symmetric Skin: Skin color, texture, turgor normal. No rashes or lesions Neurologic: Alert and oriented X 3, normal strength and tone. Normal symmetric reflexes. Normal coordination and gait  EKG atrial fibrillation with a ventricular response of 120, right bundle-branch block and left posterior fascicular block with nonspecific ST and T-wave changes. I personally reviewed this EKG.  ASSESSMENT AND PLAN:   Hypertension History of essential hypertension with blood pressure measures had 130/84. She is on losartan.  Paroxysmal atrial fibrillation [facility]) Ms. [place] was referred by Roque Cash for heart failure. Her echo shows normal LV function.her EKG today shows A. Fib with RVR, right bundle branch block with a ventricular response of 120. I'm going to begin her on low dose carvedilol and start Eliquis  5 mg twice a day.  Acute combined systolic and diastolic heart failure [facility]) Ms. Kasprzak was referred to me by Roque Cash for fairly new onset heart failure. She's had increasing dyspnea on exertion and lower extremity edema for the last several months. A BNP was measured by him at 900. She was begun on Lasix 20 mg a day which has resulted in improvement in her symptoms and her lower extremity edema.      Lorretta Harp MD FACP,FACC,FAHA, [facility] 06/21/2017 2:21 PM

## 2017-06-21 NOTE — Assessment & Plan Note (Signed)
Monica Lyons was referred to me by Roque Cash for fairly new onset heart failure. She's had increasing dyspnea on exertion and lower extremity edema for the last several months. A BNP was measured by him at 900. She was begun on Lasix 20 mg a day which has resulted in improvement in her symptoms and her lower extremity edema.

## 2017-06-21 NOTE — Patient Instructions (Addendum)
Medication Instructions: Your physician recommends that you continue on your current medications as directed. Please refer to the Current Medication list given to you today.  START Carvedilol 6.25 mg twice daily.  START Eliquis 5 mg twice daily.  Labwork: Your physician recommends that you return for lab work today: BMET, BNP  Testing/Procedures: Your physician has requested that you have a lexiscan myoview. For further information please visit [email or web address]. Please follow instruction sheet, as given.   Follow-Up: Your physician recommends that you schedule a follow-up appointment in: 2 weeks with Dr. Gwenlyn Found.  If you need a refill on your cardiac medications before your next appointment, please call your pharmacy.

## 2017-06-22 LAB — BASIC METABOLIC PANEL
BUN / CREAT RATIO: 19 (ref 12–28)
BUN: 25 mg/dL (ref 8–27)
CHLORIDE: 97 mmol/L (ref 96–106)
CO2: 17 mmol/L — AB (ref 20–29)
CREATININE: 1.29 mg/dL — AB (ref 0.57–1.00)
Calcium: 9.2 mg/dL (ref 8.7–10.3)
GFR calc Af Amer: 45 mL/min/{1.73_m2} — ABNORMAL LOW (ref >59)
GFR calc non Af Amer: 39 mL/min/{1.73_m2} — ABNORMAL LOW (ref >59)
GLUCOSE: 96 mg/dL (ref 65–99)
POTASSIUM: 4 mmol/L (ref 3.5–5.2)
SODIUM: 135 mmol/L (ref 134–144)

## 2017-06-22 LAB — BRAIN NATRIURETIC PEPTIDE: BNP: 629.7 pg/mL — AB (ref 0.0–100.0)

## 2017-06-23 ENCOUNTER — Telehealth (HOSPITAL_COMMUNITY): Payer: Self-pay

## 2017-06-23 NOTE — Telephone Encounter (Signed)
Encounter complete. 

## 2017-06-24 ENCOUNTER — Telehealth: Payer: Self-pay | Admitting: Cardiovascular Disease

## 2017-06-24 NOTE — Telephone Encounter (Signed)
Returned call to daughter [name] with Kristin's recommendations. She agreed w/plan, voiced understanding. She will cut current coreg 6.25mg  tablets in half for patient. She will advised her to check her BP when symptomatic if able, but also suggested to check 1-2 hours after each coreg dose so that she has an idea of her baseline.

## 2017-06-24 NOTE — Telephone Encounter (Signed)
Would recommend that patient cut carvedilol dose in half but continue with full dose of Eliquis.  If she can get a BP cuff, please note BP and HR readings when she feels this way.

## 2017-06-24 NOTE — Telephone Encounter (Signed)
D/w daughter she states that for about an hour after taking her carvedilol and Eliquis pt feels fatigued, weak and looks very pale. She does not have BP machine to take her BP. This has happened for the last couple days, she was wondering if she should hold medication and see if this helps. She states that she is going to try to go out and buy BP cuff to track BP but at this time she does not have one.  Should she hold medication? Please advise

## 2017-06-24 NOTE — Telephone Encounter (Signed)
New Message:     Pt started Tuesday on new medicine,Carvedilol and Eliquis.Thursday she seems to have starting having some side effects. She complained of everything being so bright,she asked for sunglasses.She also turn real pale.When she laid down and elevated her feet,it seems to help her.Today it happen again,both days it happen about 1 hour after she takes her medicine.

## 2017-06-27 ENCOUNTER — Ambulatory Visit: Payer: Medicare Other

## 2017-06-28 ENCOUNTER — Ambulatory Visit (INDEPENDENT_AMBULATORY_CARE_PROVIDER_SITE_OTHER): Payer: Medicare Other

## 2017-06-28 ENCOUNTER — Telehealth: Payer: Self-pay | Admitting: Physician Assistant

## 2017-06-28 ENCOUNTER — Ambulatory Visit (HOSPITAL_COMMUNITY)
Admission: RE | Admit: 2017-06-28 | Discharge: 2017-06-28 | Disposition: A | Discharge status: Home / Self Care | Payer: Medicare Other | Source: Ambulatory Visit | Attending: Cardiovascular Disease | Admitting: Cardiovascular Disease

## 2017-06-28 VITALS — BP 110/78 | HR 108 | Ht 65.0 in | Wt 133.0 lb

## 2017-06-28 DIAGNOSIS — I5041 Acute combined systolic (congestive) and diastolic (congestive) heart failure: Secondary | ICD-10-CM | POA: Diagnosis not present

## 2017-06-28 DIAGNOSIS — I480 Paroxysmal atrial fibrillation: Secondary | ICD-10-CM

## 2017-06-28 DIAGNOSIS — R079 Chest pain, unspecified: Secondary | ICD-10-CM | POA: Diagnosis not present

## 2017-06-28 DIAGNOSIS — R072 Precordial pain: Secondary | ICD-10-CM | POA: Diagnosis not present

## 2017-06-28 MED ORDER — TECHNETIUM TC 99M TETROFOSMIN IV KIT
29.2000 | PACK | Freq: Once | INTRAVENOUS | Status: AC | PRN
  Administered 2017-06-28: 29.2 via INTRAVENOUS
  Filled 2017-06-28: qty 30

## 2017-06-28 MED ORDER — REGADENOSON 0.4 MG/5ML IV SOLN
0.4000 mg | Freq: Once | INTRAVENOUS | Status: AC
  Administered 2017-06-28: 0.4 mg via INTRAVENOUS

## 2017-06-28 MED ORDER — TECHNETIUM TC 99M TETROFOSMIN IV KIT
9.9000 | PACK | Freq: Once | INTRAVENOUS | Status: AC | PRN
  Administered 2017-06-28: 9.9 via INTRAVENOUS
  Filled 2017-06-28: qty 10

## 2017-06-28 NOTE — Telephone Encounter (Signed)
This encounter was created in error - please disregard.

## 2017-06-28 NOTE — Patient Instructions (Signed)
1.) Reason for visit: Chest Pain  2.) Name of MD requesting visit: Dr.Berry  3.) H&P: Patient came into office to have a Lexiscan myoview while checking in she told receptionist she was having chest pain.Spoke to patient she stated she started having chest pain on the way to office.Rates pain # 3.She also had jaw pain while sitting in chair to be checked in.Spoke to DOD Dr.Harding he advised EKG.While doing EKG .Patient stated chest pain has stopped.   4.) Assessment and plan per MD: Dr.Harding reviewed EKG.He advised ok to have lexiscan today.Advised stat troponin and troponin in 3 hours.

## 2017-06-28 NOTE — Telephone Encounter (Signed)
Received answering service page from [facility] after hours with troponin level. Per [facility] rep, this was 0.06 with ref range 0.00-0.04. Did not see note in chart but it appears patient had stress test today. I contacted Dr. Gwenlyn Found who was listed as ordering provider of lab. Dr. Gwenlyn Found reports the patient was seen today by Dr. Ellyn Hack during stress test for chest pain at which time troponin was ordered. He advised I call the patient and if she was still having CP to go to the ER, and if feeling better, he would follow up on the nuc tomorrow to help guide next steps. Nuc has not yet resulted. I could not reach the patient initially but got in touch with her daughter who was able to then locate her mother after a half hour. The patient states she's feeling perfectly fine now, no further CP, feeling great. Per Dr. Kennon Holter recommendation will await nuc but I did advise pt to take it easy and to proceed to ER if any recurrent sx. She verbalized understanding and gratitude. Dayna Dunn PA-C

## 2017-06-29 ENCOUNTER — Other Ambulatory Visit: Payer: Self-pay | Admitting: Cardiovascular Disease

## 2017-06-29 ENCOUNTER — Telehealth: Payer: Self-pay | Admitting: *Deleted

## 2017-06-29 DIAGNOSIS — R072 Precordial pain: Secondary | ICD-10-CM

## 2017-06-29 DIAGNOSIS — R778 Other specified abnormalities of plasma proteins: Secondary | ICD-10-CM

## 2017-06-29 DIAGNOSIS — R7989 Other specified abnormal findings of blood chemistry: Secondary | ICD-10-CM

## 2017-06-29 LAB — MYOCARDIAL PERFUSION IMAGING
CHL CUP NUCLEAR SDS: 2
CHL CUP RESTING HR STRESS: 105 {beats}/min
LV dias vol: 128 mL (ref 46–106)
LVSYSVOL: 86 mL
Peak HR: 114 {beats}/min
SRS: 0
SSS: 2
TID: 0.99

## 2017-06-29 LAB — TROPONIN I
TROPONIN I: 0.06 ng/mL — AB (ref 0.00–0.04)
TROPONIN I: 0.05 ng/mL — AB (ref 0.00–0.04)

## 2017-06-29 NOTE — Telephone Encounter (Signed)
Incoming call from [facility] with a critical Troponin of 0.05. This was documented on yesterday by the provider's CMA.

## 2017-06-29 NOTE — Telephone Encounter (Signed)
Pt had episode of CP while in clinic for ST.  Was noted to be in Afib.  CP resolved after ~10 min.  Minimal troponin level elevation -- probably related to Afib. Nuc ST now finalized. - No evidence of ischemia, but low EF.  BNP was also recenlty mildly elevated -- I suspect this is the same process.  Not ACS related.  Glenetta Hew, MD

## 2017-07-01 ENCOUNTER — Ambulatory Visit: Payer: Medicare Other | Admitting: Cardiovascular Disease

## 2017-07-01 ENCOUNTER — Ambulatory Visit (INDEPENDENT_AMBULATORY_CARE_PROVIDER_SITE_OTHER): Payer: Medicare Other | Admitting: Cardiovascular Disease

## 2017-07-01 ENCOUNTER — Other Ambulatory Visit: Payer: Self-pay

## 2017-07-01 ENCOUNTER — Ambulatory Visit (HOSPITAL_COMMUNITY): Payer: Medicare Other | Attending: Cardiovascular Disease

## 2017-07-01 ENCOUNTER — Encounter: Payer: Self-pay | Admitting: Cardiovascular Disease

## 2017-07-01 VITALS — BP 119/75 | HR 100 | Ht 66.0 in | Wt 134.2 lb

## 2017-07-01 DIAGNOSIS — J90 Pleural effusion, not elsewhere classified: Secondary | ICD-10-CM | POA: Diagnosis not present

## 2017-07-01 DIAGNOSIS — I4891 Unspecified atrial fibrillation: Secondary | ICD-10-CM | POA: Diagnosis not present

## 2017-07-01 DIAGNOSIS — J45909 Unspecified asthma, uncomplicated: Secondary | ICD-10-CM | POA: Insufficient documentation

## 2017-07-01 DIAGNOSIS — I4510 Unspecified right bundle-branch block: Secondary | ICD-10-CM | POA: Diagnosis not present

## 2017-07-01 DIAGNOSIS — Z853 Personal history of malignant neoplasm of breast: Secondary | ICD-10-CM | POA: Insufficient documentation

## 2017-07-01 DIAGNOSIS — Z9012 Acquired absence of left breast and nipple: Secondary | ICD-10-CM | POA: Insufficient documentation

## 2017-07-01 DIAGNOSIS — I509 Heart failure, unspecified: Secondary | ICD-10-CM | POA: Insufficient documentation

## 2017-07-01 DIAGNOSIS — R7989 Other specified abnormal findings of blood chemistry: Secondary | ICD-10-CM

## 2017-07-01 DIAGNOSIS — Z9221 Personal history of antineoplastic chemotherapy: Secondary | ICD-10-CM | POA: Insufficient documentation

## 2017-07-01 DIAGNOSIS — I519 Heart disease, unspecified: Secondary | ICD-10-CM

## 2017-07-01 DIAGNOSIS — R748 Abnormal levels of other serum enzymes: Secondary | ICD-10-CM | POA: Insufficient documentation

## 2017-07-01 DIAGNOSIS — R072 Precordial pain: Secondary | ICD-10-CM | POA: Diagnosis not present

## 2017-07-01 DIAGNOSIS — I272 Pulmonary hypertension, unspecified: Secondary | ICD-10-CM | POA: Insufficient documentation

## 2017-07-01 DIAGNOSIS — R778 Other specified abnormalities of plasma proteins: Secondary | ICD-10-CM

## 2017-07-01 NOTE — Assessment & Plan Note (Signed)
Monica Lyons returns to the follow-up of her echo Myoview. Her Myoview showed no ischemia but did show left atrial dysfunction and a 2-D echo confirmed an EF in the 30% range which is new compared to prior echoes. She did have an episode of chest pain and jaw pain on the day of the stress test with a minimally elevated troponin but no subsequent chest pain. Based on this, I'm going to proceed with outpatient radial diagnostic coronary angiography next week to define her anatomy and rule out ischemic etiology.The patient understands that risks included but are not limited to stroke (1 in 1000), death (1 in 61), kidney failure [usually temporary] (1 in 500), bleeding (1 in 200), allergic reaction [possibly serious] (1 in 200). The patient understands and agrees to proceed

## 2017-07-01 NOTE — Patient Instructions (Signed)
   [facility] [address] [place] [address] Dept: [phone] Loc: [facility]  07/01/2017  You are scheduled for a Cardiac Catheterization on Monday, April 8 with Dr. Quay Burow.  1. Please arrive at the [facility] (Main Entrance A) at [facility]: [address] [place], [place] [address] at 11:00 AM (two hours before your procedure to ensure your preparation). Free valet parking service is available.   Special note: Every effort is made to have your procedure done on time. Please understand that emergencies sometimes delay scheduled procedures.  2. Diet: Do not eat or drink anything after midnight prior to your procedure except sips of water to take medications.  3. Labs: You will need to have blood drawn on Monday, April 1 in our office.  4. Medication instructions in preparation for your procedure:  HOLD Furosemide the morning of your procedure.  Stop taking Eliquis (Apixiban) on Saturday, April 6.  On the morning of your procedure, take any morning medicines NOT listed above.  You may use sips of water.  5. Plan for one night stay--bring personal belongings. 6. Bring a current list of your medications and current insurance cards. 7. You MUST have a responsible person to drive you home. 8. Someone MUST be with you the first 24 hours after you arrive home or your discharge will be delayed. 9. Please wear clothes that are easy to get on and off and wear slip-on shoes.  Thank you for allowing [place] to care for you!   -- [facility]   Post-Procedure Follow-Up:  Your physician recommends that you schedule a follow-up appointment in: 1-2 weeks after CATH with Dr. Gwenlyn Found.

## 2017-07-01 NOTE — Progress Notes (Signed)
Ms. Reen returns to the follow-up of her echo Myoview. Her Myoview showed no ischemia but did show left atrial dysfunction and a 2-D echo confirmed an EF in the 30% range which is new compared to prior echoes. She did have an episode of chest pain and jaw pain on the day of the stress test with a minimally elevated troponin but no subsequent chest pain. Based on this, I'm going to proceed with outpatient radial diagnostic coronary angiography next week to define her anatomy and rule out ischemic etiology.The patient understands that risks included but are not limited to stroke (1 in 1000), death (1 in 23), kidney failure [usually temporary] (1 in 500), bleeding (1 in 200), allergic reaction [possibly serious] (1 in 200). The patient understands and agrees to proceed  Lorretta Harp, M.D., [place], [facility], [place], [place] [address] [place],   [address]  [phone] 07/01/2017 4:28 PM

## 2017-07-01 NOTE — H&P (View-Only) (Signed)
Monica Lyons returns to the follow-up of her echo Myoview. Her Myoview showed no ischemia but did show left atrial dysfunction and a 2-D echo confirmed an EF in the 30% range which is new compared to prior echoes. She did have an episode of chest pain and jaw pain on the day of the stress test with a minimally elevated troponin but no subsequent chest pain. Based on this, I'm going to proceed with outpatient radial diagnostic coronary angiography next week to define her anatomy and rule out ischemic etiology.The patient understands that risks included but are not limited to stroke (1 in 1000), death (1 in 7), kidney failure [usually temporary] (1 in 500), bleeding (1 in 200), allergic reaction [possibly serious] (1 in 200). The patient understands and agrees to proceed  Lorretta Harp, M.D., [place], [facility], [place], [place] [address] [place], [place]  [address]  [phone] 07/01/2017 4:28 PM

## 2017-07-04 ENCOUNTER — Other Ambulatory Visit (HOSPITAL_COMMUNITY): Payer: Medicare Other

## 2017-07-04 ENCOUNTER — Ambulatory Visit
Admission: RE | Admit: 2017-07-04 | Discharge: 2017-07-04 | Disposition: A | Discharge status: Home / Self Care | Payer: Medicare Other | Source: Ambulatory Visit | Attending: Cardiovascular Disease | Admitting: Cardiovascular Disease

## 2017-07-04 DIAGNOSIS — R072 Precordial pain: Secondary | ICD-10-CM

## 2017-07-05 LAB — BASIC METABOLIC PANEL
BUN / CREAT RATIO: 15 (ref 12–28)
BUN: 24 mg/dL (ref 8–27)
CALCIUM: 9 mg/dL (ref 8.7–10.3)
CO2: 18 mmol/L — ABNORMAL LOW (ref 20–29)
Chloride: 98 mmol/L (ref 96–106)
Creatinine, Ser: 1.57 mg/dL — ABNORMAL HIGH (ref 0.57–1.00)
GFR, EST AFRICAN AMERICAN: 35 mL/min/{1.73_m2} — AB (ref >59)
GFR, EST NON AFRICAN AMERICAN: 30 mL/min/{1.73_m2} — AB (ref >59)
Glucose: 93 mg/dL (ref 65–99)
POTASSIUM: 4.4 mmol/L (ref 3.5–5.2)
Sodium: 136 mmol/L (ref 134–144)

## 2017-07-05 LAB — CBC WITH DIFFERENTIAL/PLATELET
BASOS: 0 %
Basophils Absolute: 0 10*3/uL (ref 0.0–0.2)
EOS (ABSOLUTE): 0.1 10*3/uL (ref 0.0–0.4)
Eos: 2 %
HEMOGLOBIN: 12.9 g/dL (ref 11.1–15.9)
Hematocrit: 40.2 % (ref 34.0–46.6)
IMMATURE GRANS (ABS): 0 10*3/uL (ref 0.0–0.1)
Immature Granulocytes: 0 %
LYMPHS ABS: 1.1 10*3/uL (ref 0.7–3.1)
LYMPHS: 16 %
MCH: 27.4 pg (ref 26.6–33.0)
MCHC: 32.1 g/dL (ref 31.5–35.7)
MCV: 86 fL (ref 79–97)
Monocytes Absolute: 0.7 10*3/uL (ref 0.1–0.9)
Monocytes: 10 %
Neutrophils Absolute: 5.2 10*3/uL (ref 1.4–7.0)
Neutrophils: 72 %
PLATELETS: 232 10*3/uL (ref 150–379)
RBC: 4.7 x10E6/uL (ref 3.77–5.28)
RDW: 14 % (ref 12.3–15.4)
WBC: 7.2 10*3/uL (ref 3.4–10.8)

## 2017-07-05 LAB — APTT: aPTT: 32 s (ref 24–33)

## 2017-07-05 LAB — TSH: TSH: 2.9 u[IU]/mL (ref 0.450–4.500)

## 2017-07-05 LAB — PROTIME-INR
INR: 1.3 — ABNORMAL HIGH (ref 0.8–1.2)
Prothrombin Time: 13.2 s — ABNORMAL HIGH (ref 9.1–12.0)

## 2017-07-06 NOTE — Addendum Note (Signed)
Addended by: Waylan Rocher on: 07/06/2017 04:29 PM   Modules accepted: Orders

## 2017-07-07 ENCOUNTER — Other Ambulatory Visit: Payer: Self-pay | Admitting: Cardiovascular Disease

## 2017-07-07 ENCOUNTER — Telehealth: Payer: Self-pay | Admitting: Cardiovascular Disease

## 2017-07-07 ENCOUNTER — Telehealth: Payer: Self-pay | Admitting: *Deleted

## 2017-07-07 DIAGNOSIS — R072 Precordial pain: Secondary | ICD-10-CM

## 2017-07-07 NOTE — Telephone Encounter (Addendum)
Pt contacted pre-catheterization scheduled at [facility] for: Monday July 11, 2017 1 PM Verified arrival time and place: [place] at: 8 AM-IV hydration pre-cath Nothing to eat or drink after midnight prior to cath.  Hold: Eliquis 07/09/17, 07/10/17 07/11/17 until post cath Furosemide -hold starting 07/08/17  Losartan -hold starting 07/08/17   Except hold medications AM meds can be  taken pre-cath with sip of water.   Patient has responsible person to drive home post procedure and observe patient for 24 hours:yes  Discussed instructions with patient, she verbalized understanding.

## 2017-07-07 NOTE — Telephone Encounter (Signed)
Discussed cath instructions with patient, she verbalized understanding

## 2017-07-07 NOTE — Telephone Encounter (Signed)
Follow UP:     Pt retuning your call from today.

## 2017-07-07 NOTE — Telephone Encounter (Signed)
Spoke to pt concerning procedure. See recent Lab results for notes. Pt going to hospital 5 hours early for prehydration prior to CATH scheduled for Monday, 4/8.

## 2017-07-08 ENCOUNTER — Ambulatory Visit: Payer: Medicare Other | Admitting: Cardiovascular Disease

## 2017-07-09 ENCOUNTER — Telehealth: Payer: Self-pay | Admitting: Medical

## 2017-07-09 NOTE — Telephone Encounter (Signed)
Patient called for clarification of medications to continue/hold in preparation for [facility] on Monday 4/8.  Instructed patient that she can continue to take her vitamins and carvedilol as usual. She reports having stopped her losartan and lasix yesterday and is holding her Eliquis starting today.

## 2017-07-10 ENCOUNTER — Other Ambulatory Visit: Payer: Self-pay | Admitting: Cardiology

## 2017-07-10 MED ORDER — CARVEDILOL 6.25 MG PO TABS: 3.1250 mg | ORAL_TABLET | Freq: Two times a day (BID) | ORAL | Status: DC

## 2017-07-11 ENCOUNTER — Ambulatory Visit (HOSPITAL_COMMUNITY)
Admission: RE | Admit: 2017-07-11 | Discharge: 2017-07-11 | Disposition: A | Discharge status: Home / Self Care | Payer: Medicare Other | Source: Ambulatory Visit | Attending: Cardiovascular Disease | Admitting: Cardiovascular Disease

## 2017-07-11 ENCOUNTER — Ambulatory Visit (HOSPITAL_COMMUNITY)
Admission: RE | Disposition: A | Discharge status: Home / Self Care | Payer: Self-pay | Source: Ambulatory Visit | Attending: Cardiovascular Disease

## 2017-07-11 DIAGNOSIS — R072 Precordial pain: Secondary | ICD-10-CM

## 2017-07-11 DIAGNOSIS — I5022 Chronic systolic (congestive) heart failure: Secondary | ICD-10-CM | POA: Diagnosis not present

## 2017-07-11 DIAGNOSIS — I4891 Unspecified atrial fibrillation: Secondary | ICD-10-CM | POA: Insufficient documentation

## 2017-07-11 DIAGNOSIS — I519 Heart disease, unspecified: Secondary | ICD-10-CM | POA: Diagnosis present

## 2017-07-11 DIAGNOSIS — I2510 Atherosclerotic heart disease of native coronary artery without angina pectoris: Secondary | ICD-10-CM | POA: Diagnosis not present

## 2017-07-11 DIAGNOSIS — I5189 Other ill-defined heart diseases: Secondary | ICD-10-CM | POA: Diagnosis present

## 2017-07-11 HISTORY — PX: LEFT HEART CATH AND CORONARY ANGIOGRAPHY: CATH118249

## 2017-07-11 SURGERY — LEFT HEART CATH AND CORONARY ANGIOGRAPHY
Anesthesia: LOCAL

## 2017-07-11 MED ORDER — HEPARIN SODIUM (PORCINE) 1000 UNIT/ML IJ SOLN
INTRAMUSCULAR | Status: AC
  Filled 2017-07-11: qty 1

## 2017-07-11 MED ORDER — SODIUM CHLORIDE 0.9 % IV SOLN: 250.0000 mL | INTRAVENOUS | Status: DC | PRN

## 2017-07-11 MED ORDER — IOPAMIDOL (ISOVUE-370) INJECTION 76%
INTRAVENOUS | Status: DC | PRN
  Administered 2017-07-11: 60 mL via INTRA_ARTERIAL

## 2017-07-11 MED ORDER — HEPARIN (PORCINE) IN NACL 2-0.9 UNIT/ML-% IJ SOLN
INTRAMUSCULAR | Status: AC | PRN
  Administered 2017-07-11 (×2): 500 mL

## 2017-07-11 MED ORDER — SODIUM CHLORIDE 0.9 % WEIGHT BASED INFUSION: 1.0000 mL/kg/h | INTRAVENOUS | Status: DC

## 2017-07-11 MED ORDER — ACETAMINOPHEN 325 MG PO TABS: 650.0000 mg | ORAL_TABLET | ORAL | Status: DC | PRN

## 2017-07-11 MED ORDER — LIDOCAINE HCL (PF) 1 % IJ SOLN
INTRAMUSCULAR | Status: AC
  Filled 2017-07-11: qty 30

## 2017-07-11 MED ORDER — SODIUM CHLORIDE 0.9% FLUSH: 3.0000 mL | INTRAVENOUS | Status: DC | PRN

## 2017-07-11 MED ORDER — LIDOCAINE HCL (PF) 1 % IJ SOLN
INTRAMUSCULAR | Status: DC | PRN
  Administered 2017-07-11: 2 mL

## 2017-07-11 MED ORDER — VERAPAMIL HCL 2.5 MG/ML IV SOLN
INTRA_ARTERIAL | Status: DC | PRN
  Administered 2017-07-11: 4 mL via INTRA_ARTERIAL

## 2017-07-11 MED ORDER — ASPIRIN 81 MG PO CHEW
CHEWABLE_TABLET | ORAL | Status: AC
  Filled 2017-07-11: qty 1

## 2017-07-11 MED ORDER — ONDANSETRON HCL 4 MG/2ML IJ SOLN: 4.0000 mg | Freq: Four times a day (QID) | INTRAMUSCULAR | Status: DC | PRN

## 2017-07-11 MED ORDER — SODIUM CHLORIDE 0.9 % WEIGHT BASED INFUSION
3.0000 mL/kg/h | INTRAVENOUS | Status: AC
  Administered 2017-07-11: 3 mL/kg/h via INTRAVENOUS

## 2017-07-11 MED ORDER — VERAPAMIL HCL 2.5 MG/ML IV SOLN
INTRAVENOUS | Status: AC
  Filled 2017-07-11: qty 2

## 2017-07-11 MED ORDER — HEPARIN (PORCINE) IN NACL 2-0.9 UNIT/ML-% IJ SOLN
INTRAMUSCULAR | Status: AC
  Filled 2017-07-11: qty 1000

## 2017-07-11 MED ORDER — NITROGLYCERIN 1 MG/10 ML FOR IR/CATH LAB
INTRA_ARTERIAL | Status: AC
  Filled 2017-07-11: qty 10

## 2017-07-11 MED ORDER — HEPARIN SODIUM (PORCINE) 1000 UNIT/ML IJ SOLN
INTRAMUSCULAR | Status: DC | PRN
  Administered 2017-07-11: 3000 [IU] via INTRAVENOUS

## 2017-07-11 MED ORDER — SODIUM CHLORIDE 0.9% FLUSH: 3.0000 mL | Freq: Two times a day (BID) | INTRAVENOUS | Status: DC

## 2017-07-11 MED ORDER — SODIUM CHLORIDE 0.9 % IV SOLN: INTRAVENOUS | Status: DC

## 2017-07-11 MED ORDER — ASPIRIN 81 MG PO CHEW: 81.0000 mg | CHEWABLE_TABLET | ORAL | Status: DC

## 2017-07-11 MED ORDER — IOPAMIDOL (ISOVUE-370) INJECTION 76%
INTRAVENOUS | Status: AC
  Filled 2017-07-11: qty 100

## 2017-07-11 SURGICAL SUPPLY — 12 items
BAND CMPR LRG ZPHR (HEMOSTASIS) ×1
BAND ZEPHYR COMPRESS 30 LONG (HEMOSTASIS) ×1 IMPLANT
CATH INFINITI 5FR ANG PIGTAIL (CATHETERS) ×1 IMPLANT
CATH OPTITORQUE TIG 4.0 5F (CATHETERS) ×1 IMPLANT
GLIDESHEATH SLEND A-KIT 6F 22G (SHEATH) ×1 IMPLANT
GUIDEWIRE INQWIRE 1.5J.035X260 (WIRE) IMPLANT
INQWIRE 1.5J .035X260CM (WIRE) ×2
KIT HEART LEFT (KITS) ×2 IMPLANT
PACK CARDIAC CATHETERIZATION (CUSTOM PROCEDURE TRAY) ×2 IMPLANT
TRANSDUCER W/STOPCOCK (MISCELLANEOUS) ×2 IMPLANT
TUBING CIL FLEX 10 FLL-RA (TUBING) ×2 IMPLANT
WIRE HI TORQ VERSACORE-J 145CM (WIRE) ×1 IMPLANT

## 2017-07-11 NOTE — Discharge Instructions (Signed)

## 2017-07-11 NOTE — Interval H&P Note (Signed)
Cath Lab Visit (complete for each Cath Lab visit)  Clinical Evaluation Leading to the Procedure:   ACS: No.  Non-ACS:    Anginal Classification: CCS II  Anti-ischemic medical therapy: Minimal Therapy (1 class of medications)  Non-Invasive Test Results: Intermediate-risk stress test findings: cardiac mortality 1-3%/year  Prior CABG: No previous CABG      History and Physical Interval Note:  07/11/2017 11:11 AM  Monica Lyons  has presented today for surgery, with the diagnosis of cp  The various methods of treatment have been discussed with the patient and family. After consideration of risks, benefits and other options for treatment, the patient has consented to  Procedure(s): LEFT HEART CATH AND CORONARY ANGIOGRAPHY (N/A) as a surgical intervention .  The patient's history has been reviewed, patient examined, no change in status, stable for surgery.  I have reviewed the patient's chart and labs.  Questions were answered to the patient's satisfaction.     Quay Burow

## 2017-07-12 ENCOUNTER — Encounter (HOSPITAL_COMMUNITY): Payer: Self-pay | Admitting: Cardiovascular Disease

## 2017-07-12 ENCOUNTER — Ambulatory Visit: Payer: Medicare Other

## 2017-07-12 MED FILL — Heparin Sodium (Porcine) 2 Unit/ML in Sodium Chloride 0.9%: INTRAMUSCULAR | Qty: 1000 | Status: AC

## 2017-07-13 ENCOUNTER — Telehealth: Payer: Self-pay | Admitting: Cardiovascular Disease

## 2017-07-13 NOTE — Telephone Encounter (Signed)
Eliquis is always a twice a day drug as his carvedilol.

## 2017-07-13 NOTE — Telephone Encounter (Signed)
New Message:     Pt c/o medication issue:  1. Name of Medication: carvedilol (COREG) 6.25 MG tablet  2. How are you currently taking this medication (dosage and times per day)? Take 0.5 tablets (3.125 mg total) by mouth 2 (two) times daily with a meal.  3. Are you having a reaction (difficulty breathing--STAT)? Making this bright and so Dr. Gwenlyn Found suggests decreasing the dosage.  4. What is your medication issue? Dosage confusion    Dr. Lennice Sites is giving pt different instructions on how to take medication. Dr. Lennice Sites is advising pt to take 12 mg a day and our instructions are different. Pt would like to know specifically what dosage she should be taking

## 2017-07-13 NOTE — Telephone Encounter (Signed)
Returned the call to the son, per dpr. He was calling to verify the Carvedilol dose and the Eliquis dose.   Per the medication list, the patient should currently be on Carvedilol 3.125 bid and Eliquis 5 mg bid.   The son stated that at discharge (from a cardiac cath) they were told that the patient should take Eliquis once daily. Per discharge instructions in epic, the patient should still be on Eliquis bid. They would feel better if they had a confirmation of dosage by Dr. Gwenlyn Found. Message has been routed for his clarification.

## 2017-07-21 NOTE — Telephone Encounter (Signed)
Informed pt of recommendations. ?

## 2017-08-02 ENCOUNTER — Encounter: Payer: Self-pay | Admitting: Cardiovascular Disease

## 2017-08-02 ENCOUNTER — Ambulatory Visit (INDEPENDENT_AMBULATORY_CARE_PROVIDER_SITE_OTHER): Payer: Medicare Other | Admitting: Cardiovascular Disease

## 2017-08-02 VITALS — BP 151/101 | HR 77 | Ht 66.0 in | Wt 123.8 lb

## 2017-08-02 DIAGNOSIS — I5041 Acute combined systolic (congestive) and diastolic (congestive) heart failure: Secondary | ICD-10-CM

## 2017-08-02 DIAGNOSIS — I10 Essential (primary) hypertension: Secondary | ICD-10-CM | POA: Diagnosis not present

## 2017-08-02 DIAGNOSIS — I480 Paroxysmal atrial fibrillation: Secondary | ICD-10-CM | POA: Diagnosis not present

## 2017-08-02 DIAGNOSIS — I2510 Atherosclerotic heart disease of native coronary artery without angina pectoris: Secondary | ICD-10-CM | POA: Insufficient documentation

## 2017-08-02 NOTE — Assessment & Plan Note (Signed)
History of combined systolic and diastolic heart failure with ejection fraction 30 to 35%.  She does have mild to moderate mitral regurgitation with moderate pulmonary hypertension.  She has improved with diuretics may benefit from the addition of Entresto.  I am going to refer her to the heart failure clinic for further evaluation .she also may benefit from restoration of sinus rhythm.

## 2017-08-02 NOTE — Assessment & Plan Note (Signed)
History of essential hypertension blood pressure measured today at 151/101 is on losartan and carvedilol.

## 2017-08-02 NOTE — Patient Instructions (Signed)
Medication Instructions: Your physician recommends that you continue on your current medications as directed. Please refer to the Current Medication list given to you today.   Follow-Up: You have been referred to the Heart Failure Clinic.  Your physician recommends that you schedule a follow-up appointment in: 3 months with Dr. Gwenlyn Found.

## 2017-08-02 NOTE — Assessment & Plan Note (Signed)
History of mild CAD by cardiac catheterization which I recently performed radially 07/11/2017.  She had a 60% mid to distal RCA stenosis and a 50% proximal LAD stenosis which I think was unrelated to her LV dysfunction.

## 2017-08-02 NOTE — Assessment & Plan Note (Addendum)
History of atrial fibrillation on Eliquis oral anticoagulation and carvedilol for rate control.  It is unclear the chronicity of her A. fib although her left atrial dimensions are fairly normal.  She may benefit from restoration of normal sinus rhythm.

## 2017-08-02 NOTE — Progress Notes (Signed)
08/02/2017 Monica Lyons   December 17, 1932  [id number]  Primary Physician Secundino Ginger, PA-C Primary Cardiologist: Lorretta Harp MD [name], [place]  HPI:  Monica Lyons is a 84 y.o.  widowed Caucasian female mother of 2, grandmother of 7 grandchildren who was referred by Roque Cash for cardiovascular evaluation treatment of congestive heart failure.  I last saw her in the office/19/19.  She had a 2-D echocardiogram performed 02/14/17 which was essentially normal with a RV end-diastolic pressure of 40 mmHg. She's had increasing dyspnea and lower extreme edema over the last several months. She's never had a heart attack or stroke. A recent 2-D echo performed several days ago showed similar findings as the previous one. She was prescribed carvedilol which she did not fill as well as low-dose Lasix which has improved her symptoms and lower shin edema. BNP was measured at 900.  I performed outpatient cardiac catheterization on her radially 07/11/2017 revealing noncritical CAD addressing that she had a nonischemic cardiomyopathy.  Clinically improved with rate control, oral diuretics is on Eliquis oral anticoagulation stroke prophylaxis.     Current Meds  Medication Sig  . albuterol (PROVENTIL HFA;VENTOLIN HFA) 108 (90 BASE) MCG/ACT inhaler Inhale 2 puffs into the lungs every 6 (six) hours as needed for wheezing or shortness of breath.   Marland Kitchen apixaban (ELIQUIS) 5 MG TABS tablet Take 1 tablet (5 mg total) by mouth 2 (two) times daily.  . benzonatate (TESSALON) 100 MG capsule Take 100 mg by mouth 3 (three) times daily as needed for cough.  . carvedilol (COREG) 6.25 MG tablet Take 0.5 tablets (3.125 mg total) by mouth 2 (two) times daily with a meal.  . Cholecalciferol (VITAMIN D3) 1000 units CAPS Take 1,000 Units by mouth 2 (two) times daily.   Marland Kitchen EPINEPHrine 0.3 mg/0.3 mL IJ SOAJ injection Inject 0.3 mg into the muscle once.   . furosemide (LASIX) 20 MG tablet Take 20 mg by  mouth daily.   Marland Kitchen losartan (COZAAR) 50 MG tablet Take 50 mg by mouth 2 (two) times daily.  Marland Kitchen omeprazole (PRILOSEC) 20 MG capsule Take 20 mg by mouth daily as needed (for acid reflux).  . vitamin B-12 (CYANOCOBALAMIN) 500 MCG tablet Take 500 mcg by mouth 2 (two) times daily.     Allergies  Allergen Reactions  . Aspirin Anaphylaxis  . Bee Venom Anaphylaxis  . Erythromycin Anaphylaxis  . Other Anaphylaxis and Other (See Comments)    [place]  . Nutmeg Oil (Myristica Oil) Nausea And Vomiting  . Zoster Vaccine Live Other (See Comments)    Mycin family  . Amlodipine Besylate Other (See Comments)    Muscle cramps - pt unaware of allergy  . Chlorthalidone Other (See Comments)    Hyponatremia - pt unaware of allergy  . Diltiazem Hcl Other (See Comments)    Syncope - pt unaware of allergy  . Morphine And Related Nausea And Vomiting  . Sulfa Antibiotics Other (See Comments)    Unknown  . Symbicort [Budesonide-Formoterol Fumarate] Other (See Comments)    Unknown  . Talc Other (See Comments)    Blood comes out of fingernails  . Tape Hives    Social History   Socioeconomic History  . Marital status: Married    Spouse name: Not on file  . Number of children: Not on file  . Years of education: Not on file  . Highest education level: Not on file  Occupational History  . Not on file  Social Needs  . Financial resource strain: Not on file  . Food insecurity:    Worry: Not on file    Inability: Not on file  . Transportation needs:    Medical: Not on file    Non-medical: Not on file  Tobacco Use  . Smoking status: Never Smoker  . Smokeless tobacco: Never Used  Substance and Sexual Activity  . Alcohol use: No  . Drug use: No  . Sexual activity: Not Currently    Birth control/protection: Post-menopausal  Lifestyle  . Physical activity:    Days per week: Not on file    Minutes per session: Not on file  . Stress: Not on file  Relationships  . Social connections:    Talks on  phone: Not on file    Gets together: Not on file    Attends religious service: Not on file    Active member of club or organization: Not on file    Attends meetings of clubs or organizations: Not on file    Relationship status: Not on file  . Intimate partner violence:    Fear of current or ex partner: Not on file    Emotionally abused: Not on file    Physically abused: Not on file    Forced sexual activity: Not on file  Other Topics Concern  . Not on file  Social History Narrative  . Not on file     Review of Systems: General: negative for chills, fever, night sweats or weight changes.  Cardiovascular: negative for chest pain, dyspnea on exertion, edema, orthopnea, palpitations, paroxysmal nocturnal dyspnea or shortness of breath Dermatological: negative for rash Respiratory: negative for cough or wheezing Urologic: negative for hematuria Abdominal: negative for nausea, vomiting, diarrhea, bright red blood per rectum, melena, or hematemesis Neurologic: negative for visual changes, syncope, or dizziness All other systems reviewed and are otherwise negative except as noted above.    Blood pressure (!) 151/101, pulse 77, height 5\' 6"  (1.676 m), weight 123 lb 12.8 oz (56.2 kg).  General appearance: alert and no distress Neck: no adenopathy, no carotid bruit, no JVD, supple, symmetrical, trachea midline and thyroid not enlarged, symmetric, no tenderness/mass/nodules Lungs: clear to auscultation bilaterally Heart: regularly irregular rhythm Extremities: 1+ pitting edema bilaterally Pulses: 2+ and symmetric Skin: Skin color, texture, turgor normal. No rashes or lesions Neurologic: Alert and oriented X 3, normal strength and tone. Normal symmetric reflexes. Normal coordination and gait  EKG not performed today  ASSESSMENT AND PLAN:   Hypertension History of essential hypertension blood pressure measured today at 151/101 is on losartan and carvedilol.    Paroxysmal atrial  fibrillation (HCC) History of atrial fibrillation on Eliquis oral anticoagulation and carvedilol for rate control.  It is unclear the chronicity of her A. fib although her left atrial dimensions are fairly normal.  She may benefit from restoration of normal sinus rhythm.  Acute combined systolic and diastolic heart failure (HCC) History of combined systolic and diastolic heart failure with ejection fraction 30 to 35%.  She does have mild to moderate mitral regurgitation with moderate pulmonary hypertension.  She has improved with diuretics may benefit from the addition of Entresto.  I am going to refer her to the heart failure clinic for further evaluation .she also may benefit from restoration of sinus rhythm.  Coronary artery disease History of mild CAD by cardiac catheterization which I recently performed radially 07/11/2017.  She had a 60% mid to distal RCA stenosis and a 50% proximal LAD  stenosis which I think was unrelated to her LV dysfunction.      Lorretta Harp MD FACP,FACC,FAHA, [facility] 08/02/2017 2:50 PM

## 2017-08-03 ENCOUNTER — Telehealth: Payer: Self-pay | Admitting: Cardiovascular Disease

## 2017-08-03 NOTE — Telephone Encounter (Signed)
Okay to travel

## 2017-08-03 NOTE — Telephone Encounter (Signed)
Pt report she will be traveling 4 hours by car to [place], [place]. She states she is taking her Elquis as prescribed, has compression hose to wear and will be stopping frequently to stretch/walk. She reports she is wanting to get the ok from Dr.Berry in order to travel that far. Routed to Doctor and primary nurse.

## 2017-08-03 NOTE — Telephone Encounter (Signed)
New Message:       Pt is needing some help confirming some after visit notes

## 2017-08-03 NOTE — Telephone Encounter (Signed)
Returned call to pt who reports per Dr. Gwenlyn Found she is to not [place] Albuterol due to the risk of increasing HR. Per Pharm D advised pt to contact Pulmonary provider to inquire about different options. Pt verbalized understanding .

## 2017-08-03 NOTE — Telephone Encounter (Signed)
New message  Pt verbalzied that she is calling for RN  She is very excited about a family trip in a few weeks  She will be gone for a few days and she want to know if she is safe to  Ride 4 hours to [place] her son will drive her ( her family wants to know if it is okay and if it will interfere with her care)   She said that she has support stockings and she will be able to stop to stretch and use the br as   Much as she needs to question:  Do the doctor think its okay for her to go? Do she need her compression stockings? What medications do she really need to take?

## 2017-08-12 ENCOUNTER — Telehealth: Payer: Self-pay | Admitting: Cardiovascular Disease

## 2017-08-12 NOTE — Telephone Encounter (Signed)
Spoke with pt, she reports swelling in both feet and ankles and SOB when walking up stairs. The swelling is better than before but is not gone. Her weight is stable. Her paperwork states furosemide 20 mg once daily, she reports taking 40 mg of furosemide. Instructed patient to take 1 and 1/2 of the 40 mg tablets once daily for the next 3 days and then go back to one tablet daily. She was cautioned to drink only 1 to 1 and 1/2 liters of fluid daily. She reports she gets UTI's if she does not drink a lot of fluids and she feels she may have a UTI now. Explained heart failure and fluids relationship. She will call her medical doctor about the UTI symptoms and will call us back after 3 days if no improvement in her symptoms.

## 2017-08-12 NOTE — Telephone Encounter (Signed)
New Message:       Pt c/o medication issue:  1. Name of Medication: furosemide (LASIX) 20 MG tablet  albuterol (PROVENTIL HFA;VENTOLIN HFA) 108 (90 BASE) MCG/ACT inhaler  2. How are you currently taking this medication (dosage and times per day)? Take 20 mg by mouth daily. (Lasix)  Inhale 2 puffs into the lungs every 6 (six) hours as needed for wheezing or shortness of breath.   3. Are you having a reaction (difficulty breathing--STAT)? No  4. What is your medication issue? Pt states she needs clarification on these medications. Pt states her lasix has been decreased and she was  Unaware of this.

## 2017-08-16 NOTE — Telephone Encounter (Signed)
Spoke with pt, aware of dr berry's remarks. 

## 2017-09-14 ENCOUNTER — Telehealth (HOSPITAL_COMMUNITY): Payer: Self-pay | Admitting: *Deleted

## 2017-09-14 ENCOUNTER — Ambulatory Visit (HOSPITAL_COMMUNITY)
Admission: RE | Admit: 2017-09-14 | Discharge: 2017-09-14 | Disposition: A | Discharge status: Home / Self Care | Payer: Medicare Other | Source: Ambulatory Visit | Attending: Internal Medicine | Admitting: Internal Medicine

## 2017-09-14 VITALS — BP 151/82 | HR 98 | Wt 111.0 lb

## 2017-09-14 DIAGNOSIS — I2510 Atherosclerotic heart disease of native coronary artery without angina pectoris: Secondary | ICD-10-CM | POA: Insufficient documentation

## 2017-09-14 DIAGNOSIS — I5022 Chronic systolic (congestive) heart failure: Secondary | ICD-10-CM | POA: Diagnosis not present

## 2017-09-14 DIAGNOSIS — I480 Paroxysmal atrial fibrillation: Secondary | ICD-10-CM | POA: Insufficient documentation

## 2017-09-14 DIAGNOSIS — Z780 Asymptomatic menopausal state: Secondary | ICD-10-CM | POA: Insufficient documentation

## 2017-09-14 DIAGNOSIS — Z79899 Other long term (current) drug therapy: Secondary | ICD-10-CM | POA: Insufficient documentation

## 2017-09-14 DIAGNOSIS — Z7901 Long term (current) use of anticoagulants: Secondary | ICD-10-CM | POA: Insufficient documentation

## 2017-09-14 DIAGNOSIS — Z886 Allergy status to analgesic agent status: Secondary | ICD-10-CM | POA: Diagnosis not present

## 2017-09-14 DIAGNOSIS — Z91048 Other nonmedicinal substance allergy status: Secondary | ICD-10-CM | POA: Insufficient documentation

## 2017-09-14 DIAGNOSIS — Z885 Allergy status to narcotic agent status: Secondary | ICD-10-CM | POA: Insufficient documentation

## 2017-09-14 DIAGNOSIS — I110 Hypertensive heart disease with heart failure: Secondary | ICD-10-CM | POA: Insufficient documentation

## 2017-09-14 DIAGNOSIS — I10 Essential (primary) hypertension: Secondary | ICD-10-CM | POA: Diagnosis not present

## 2017-09-14 DIAGNOSIS — Z888 Allergy status to other drugs, medicaments and biological substances status: Secondary | ICD-10-CM | POA: Diagnosis not present

## 2017-09-14 DIAGNOSIS — I429 Cardiomyopathy, unspecified: Secondary | ICD-10-CM | POA: Insufficient documentation

## 2017-09-14 DIAGNOSIS — Z9221 Personal history of antineoplastic chemotherapy: Secondary | ICD-10-CM | POA: Insufficient documentation

## 2017-09-14 DIAGNOSIS — Z853 Personal history of malignant neoplasm of breast: Secondary | ICD-10-CM | POA: Diagnosis not present

## 2017-09-14 DIAGNOSIS — Z882 Allergy status to sulfonamides status: Secondary | ICD-10-CM | POA: Insufficient documentation

## 2017-09-14 DIAGNOSIS — J45909 Unspecified asthma, uncomplicated: Secondary | ICD-10-CM | POA: Diagnosis not present

## 2017-09-14 DIAGNOSIS — I519 Heart disease, unspecified: Secondary | ICD-10-CM | POA: Diagnosis not present

## 2017-09-14 DIAGNOSIS — M8580 Other specified disorders of bone density and structure, unspecified site: Secondary | ICD-10-CM | POA: Insufficient documentation

## 2017-09-14 DIAGNOSIS — Z91018 Allergy to other foods: Secondary | ICD-10-CM | POA: Diagnosis not present

## 2017-09-14 MED ORDER — APIXABAN 2.5 MG PO TABS: 2.5000 mg | ORAL_TABLET | Freq: Two times a day (BID) | ORAL | 6 refills | Status: DC

## 2017-09-14 MED ORDER — SACUBITRIL-VALSARTAN 49-51 MG PO TABS: 1.0000 | ORAL_TABLET | Freq: Two times a day (BID) | ORAL | 3 refills | Status: DC

## 2017-09-14 MED ORDER — AMIODARONE HCL 200 MG PO TABS: 200.0000 mg | ORAL_TABLET | Freq: Every day | ORAL | 6 refills | Status: DC

## 2017-09-14 MED ORDER — SACUBITRIL-VALSARTAN 49-51 MG PO TABS: 1.0000 | ORAL_TABLET | Freq: Two times a day (BID) | ORAL | 11 refills | Status: DC

## 2017-09-14 NOTE — H&P (View-Only) (Signed)
ADVANCED HF CLINIC CONSULT NOTE  09/14/2017 Monica Lyons   08/19/1932  [id number]  Primary Physician Secundino Ginger, PA-C Primary Cardiologist: Lorretta Harp MD [name], [place]  HPI:  Monica Lyons is a 84 y.o.  widowed female with h/o HTN, breast cancer and atrial fibrilaltion. Referred by Dr. Gwenlyn Found for further evaluation of HF.   She is a very poor historian and cannot answer questions directly.. Says she got short of breath but can't remember when that was. Cannot even provide an estimate. When I asked her why she went to see Dr. Claudie Leach she said "because he was close".   She tells me shw was treated for breast CA 25 years ago but doesn't remember where. Does not know what chemotherapy she received. Says they told her at the time that one of the medicines may have some heart problems but they told her they were going to use a very low dose. Does not remember getting a bright red medicine (adriamycin)   In reviewing records, apparently she had echo with Dr. Claudie Leach at [facility] in 02/14/17 which was essentially normal. She was referred to Dr. Gwenlyn Found in 3/19 for CHF symptoms. At the time of that visit she was in AF at a rate of 120. Duration of AF unknown. She was started on low-dose carvedilol and Eliquis 5 bid. A myoview was arranged which showed EF 33%. There was no ischemia or infarct. She subsequently had an echo the next day that showed EF 30-35%. BNP was 900  Dr. Gwenlyn Found thus performed an outpatient cardiac catheterization on 07/11/2017 revealing noncritical CAD addressing that she had a nonischemic cardiomyopathy. A RHC was not performed. She was referred here for further HF w/u.   Says she just returned from family vacation and was very active. Denies any DOE, edema, CP, orthopnea or PND. No bleeding on Eliquis   Studies:  (all images reviewed personally)  Echo 07/01/17 EF 30-35% Mild to moderate MR  Significant septal-lateral left ventricular   wall  dyssynchrony RVSP 45  Cath 07/11/17 LAD p50% LCx ok RCA p25%, m30% LVEDP 10   Past Medical History:  Diagnosis Date  . Asthma   . Breast cancer ([place])   . Cancer ([place])   . Hypertension   . Osteopenia 04/2012   T score -2.0 FRAX 31%/19%  . Personal history of chemotherapy      Current Meds  Medication Sig  . albuterol (PROVENTIL HFA;VENTOLIN HFA) 108 (90 BASE) MCG/ACT inhaler Inhale 2 puffs into the lungs every 6 (six) hours as needed for wheezing or shortness of breath.   Marland Kitchen apixaban (ELIQUIS) 5 MG TABS tablet Take 1 tablet (5 mg total) by mouth 2 (two) times daily.  . carvedilol (COREG) 6.25 MG tablet Take 0.5 tablets (3.125 mg total) by mouth 2 (two) times daily with a meal.  . Cholecalciferol (VITAMIN D3) 1000 units CAPS Take 1,000 Units by mouth 2 (two) times daily.   Marland Kitchen EPINEPHrine 0.3 mg/0.3 mL IJ SOAJ injection Inject 0.3 mg into the muscle once.   . fexofenadine (ALLEGRA) 180 MG tablet Take 180 mg by mouth daily.  . furosemide (LASIX) 20 MG tablet Take 20 mg by mouth daily.   Marland Kitchen losartan (COZAAR) 50 MG tablet Take 50 mg by mouth 2 (two) times daily.  . vitamin B-12 (CYANOCOBALAMIN) 500 MCG tablet Take 500 mcg by mouth 2 (two) times daily.     Allergies  Allergen Reactions  . Aspirin Anaphylaxis  . Bee Venom  Anaphylaxis  . Erythromycin Anaphylaxis  . Other Anaphylaxis and Other (See Comments)    [place]  . Nutmeg Oil (Myristica Oil) Nausea And Vomiting  . Zoster Vaccine Live Other (See Comments)    Mycin family  . Amlodipine Besylate Other (See Comments)    Muscle cramps - pt unaware of allergy  . Chlorthalidone Other (See Comments)    Hyponatremia - pt unaware of allergy  . Diltiazem Hcl Other (See Comments)    Syncope - pt unaware of allergy  . Morphine And Related Nausea And Vomiting  . Sulfa Antibiotics Other (See Comments)    Unknown  . Symbicort [Budesonide-Formoterol Fumarate] Other (See Comments)    Unknown  . Talc Other (See Comments)    Blood comes  out of fingernails  . Tape Hives    Social History   Socioeconomic History  . Marital status: Married    Spouse name: Not on file  . Number of children: Not on file  . Years of education: Not on file  . Highest education level: Not on file  Occupational History  . Not on file  Social Needs  . Financial resource strain: Not on file  . Food insecurity:    Worry: Not on file    Inability: Not on file  . Transportation needs:    Medical: Not on file    Non-medical: Not on file  Tobacco Use  . Smoking status: Never Smoker  . Smokeless tobacco: Never Used  Substance and Sexual Activity  . Alcohol use: No  . Drug use: No  . Sexual activity: Not Currently    Birth control/protection: Post-menopausal  Lifestyle  . Physical activity:    Days per week: Not on file    Minutes per session: Not on file  . Stress: Not on file  Relationships  . Social connections:    Talks on phone: Not on file    Gets together: Not on file    Attends religious service: Not on file    Active member of club or organization: Not on file    Attends meetings of clubs or organizations: Not on file    Relationship status: Not on file  . Intimate partner violence:    Fear of current or ex partner: Not on file    Emotionally abused: Not on file    Physically abused: Not on file    Forced sexual activity: Not on file  Other Topics Concern  . Not on file  Social History Narrative  . Not on file     Review of Systems: [y] = yes, [ ]  = no    General: Weight gain [] ; Weight loss [ ] ; Anorexia [ ] ; Fatigue [] ; Fever [ ] ; Chills [ ] ; Weakness Blue.Reese ]   Cardiac: Chest pain/pressure [ ] ; Resting SOB [ y]; Exertional SOB [] ; Orthopnea [ ] ; Pedal Edema [] ; Palpitations [ ] ; Syncope [ ] ; Presyncope [ ] ; Paroxysmal nocturnal dyspnea[ ]    Pulmonary: Cough [ ] ; Wheezing[ ] ; Hemoptysis[ ] ; Sputum [ ] ; Snoring [ ]    GI: Vomiting[ ] ; Dysphagia[ ] ; Melena[ ] ; Hematochezia [ ] ; Heartburn[ ] ; Abdominal pain [ ] ;  Constipation [ ] ; Diarrhea [ ] ; BRBPR [ ]    GU: Hematuria[ ] ; Dysuria [ ] ; Nocturia[ ]   Vascular: Pain in legs with walking [ ] ; Pain in feet with lying flat [ ] ; Non-healing sores [ ] ; Stroke [ ] ; TIA [ ] ; Slurred speech [ ] ;   Neuro: Headaches[ ] ; Vertigo[ ] ; Seizures[ ] ;  Paresthesias[ ] ;Blurred vision [ ] ; Diplopia [ ] ; Vision changes [ ]    Ortho/Skin: Arthritis [y]; Joint pain [y]; Muscle pain [ ] ; Joint swelling [ ] ; Back Pain [ ] ; Rash [ ]    Psych: Depression[ ] ; Anxiety[ ]    Heme: Bleeding problems [ ] ; Clotting disorders [ ] ; Anemia [ ]    Endocrine: Diabetes [ ] ; Thyroid dysfunction[ ]    Blood pressure (!) 151/82, pulse 98, weight 111 lb (50.3 kg), SpO2 99 %.  General: Thin elderly woman. No resp difficulty HEENT: normal Neck: supple. no JVD. Carotids 2+ bilat; no bruits. No lymphadenopathy or thryomegaly appreciated. Cor: PMI nondisplaced. Irregular rate & rhythm. No rubs, gallops or murmurs. Lungs: clear Abdomen: soft, nontender, nondistended. No hepatosplenomegaly. No bruits or masses. Good bowel sounds. Extremities: no cyanosis, clubbing, rash, edema Neuro: alert & orientedx3, cranial nerves grossly intact. moves all 4 extremities w/o difficulty. Affect odd. talkative  EKG not performed today  ASSESSMENT AND PLAN:   1. Chronic systolic heart failure - EF normal in 11/18 - Myoview 3/19 EF 33% - Echo 3/19 EF 30-35% - cath 4/19 non-obstructive CAD. No RHC performed - NYHA I. Volume status ok - I suspect she has NICM due to tachy-mediated CM in setting of AF with RVR but cannot exclude component of chemo toxicity but unlikely with EF normal in 10/18 - Will start amio 200 bid and plan DC-CV in several weeks - Given age and weight, decrease Eliquis to 2.5 bid  - Stop losartan. Start Entresto 49/51 in setting of HTN. Stop lasix - Continue carvedilol 3.125 bid - Check cMRI  2. PAF with RVR - As above, suspect tachy-mediated CM - Will start amio 200 bid and plan  DC-CV in several weeks - Given age and weight, decrease Eliquis to 2.5 bid   3. HTN - BP elevated. May be contributing to CM - Med adjustments as above  4. Memory issues - she seems to have quite some difficulty with remembering facts ot at least communicating them. I wonder if she has early dementia. - I called her daughter to discuss but did not get an email. I left VM for her to call back.   Total time spent > 60 minutes. Over half that time spent discussing above.   Glori Bickers, MD  10:51 PM

## 2017-09-14 NOTE — Patient Instructions (Signed)
Stop Losartan  Stop Furosemide  Start Entresto 49/51 mg Twice daily   Decrease Eliquis to 2.5 mg Twice daily   Start Amiodarone 200 mg Twice daily   Your physician has requested that you have a cardiac MRI. Cardiac MRI uses a computer to create images of your heart as its beating, producing both still and moving pictures of your heart and major blood vessels. For further information please visit [email or web address] Please follow the instruction sheet given to you today for more information.  RADIOLOGY WILL CALL YOU TO SCHEDULE THIS  Your physician has recommended that you have a Cardioversion (DCCV). Electrical Cardioversion uses a jolt of electricity to your heart either through paddles or wired patches attached to your chest. This is a controlled, usually prescheduled, procedure. Defibrillation is done under light anesthesia in the hospital, and you usually go home the day of the procedure. This is done to get your heart back into a normal rhythm. You are not awake for the procedure. Please see the instruction sheet given to you today.  YOU NEED SOMEONE TO BRING YOU TO THIS APPOINTMENT, PLEASE ARRANGE THIS, WE WILL CALL YOU NEXT WEEK TO FOLLOW UP  Your physician recommends that you schedule a follow-up appointment in: 2 months (Tuesday Aug 13th at 1:40 PM)

## 2017-09-14 NOTE — Progress Notes (Signed)
ADVANCED HF CLINIC CONSULT NOTE  09/14/2017 Monica Lyons   [birth year]  [id number]  Primary Physician Secundino Ginger, PA-C Primary Cardiologist: Lorretta Harp MD [name], [place]  HPI:  Monica Lyons is a 84 y.o.  widowed female with h/o HTN, breast cancer and atrial fibrilaltion. Referred by Dr. Gwenlyn Found for further evaluation of HF.   She is a very poor historian and cannot answer questions directly.. Says she got short of breath but can't remember when that was. Cannot even provide an estimate. When I asked her why she went to see Dr. Claudie Leach she said "because he was close".   She tells me shw was treated for breast CA 25 years ago but doesn't remember where. Does not know what chemotherapy she received. Says they told her at the time that one of the medicines may have some heart problems but they told her they were going to use a very low dose. Does not remember getting a bright red medicine (adriamycin)   In reviewing records, apparently she had echo with Dr. Claudie Leach at [facility] in 02/14/17 which was essentially normal. She was referred to Dr. Gwenlyn Found in 3/19 for CHF symptoms. At the time of that visit she was in AF at a rate of 120. Duration of AF unknown. She was started on low-dose carvedilol and Eliquis 5 bid. A myoview was arranged which showed EF 33%. There was no ischemia or infarct. She subsequently had an echo the next day that showed EF 30-35%. BNP was 900  Dr. Gwenlyn Found thus performed an outpatient cardiac catheterization on 07/11/2017 revealing noncritical CAD addressing that she had a nonischemic cardiomyopathy. A RHC was not performed. She was referred here for further HF w/u.   Says she just returned from family vacation and was very active. Denies any DOE, edema, CP, orthopnea or PND. No bleeding on Eliquis   Studies:  (all images reviewed personally)  Echo 07/01/17 EF 30-35% Mild to moderate MR  Significant septal-lateral left ventricular   wall  dyssynchrony RVSP 45  Cath 07/11/17 LAD p50% LCx ok RCA p25%, m30% LVEDP 10   Past Medical History:  Diagnosis Date  . Asthma   . Breast cancer ([place])   . Cancer ([place])   . Hypertension   . Osteopenia 04/2012   T score -2.0 FRAX 31%/19%  . Personal history of chemotherapy      Current Meds  Medication Sig  . albuterol (PROVENTIL HFA;VENTOLIN HFA) 108 (90 BASE) MCG/ACT inhaler Inhale 2 puffs into the lungs every 6 (six) hours as needed for wheezing or shortness of breath.   Marland Kitchen apixaban (ELIQUIS) 5 MG TABS tablet Take 1 tablet (5 mg total) by mouth 2 (two) times daily.  . carvedilol (COREG) 6.25 MG tablet Take 0.5 tablets (3.125 mg total) by mouth 2 (two) times daily with a meal.  . Cholecalciferol (VITAMIN D3) 1000 units CAPS Take 1,000 Units by mouth 2 (two) times daily.   Marland Kitchen EPINEPHrine 0.3 mg/0.3 mL IJ SOAJ injection Inject 0.3 mg into the muscle once.   . fexofenadine (ALLEGRA) 180 MG tablet Take 180 mg by mouth daily.  . furosemide (LASIX) 20 MG tablet Take 20 mg by mouth daily.   Marland Kitchen losartan (COZAAR) 50 MG tablet Take 50 mg by mouth 2 (two) times daily.  . vitamin B-12 (CYANOCOBALAMIN) 500 MCG tablet Take 500 mcg by mouth 2 (two) times daily.     Allergies  Allergen Reactions  . Aspirin Anaphylaxis  . Bee Venom  Anaphylaxis  . Erythromycin Anaphylaxis  . Other Anaphylaxis and Other (See Comments)    [place]  . Nutmeg Oil (Myristica Oil) Nausea And Vomiting  . Zoster Vaccine Live Other (See Comments)    Mycin family  . Amlodipine Besylate Other (See Comments)    Muscle cramps - pt unaware of allergy  . Chlorthalidone Other (See Comments)    Hyponatremia - pt unaware of allergy  . Diltiazem Hcl Other (See Comments)    Syncope - pt unaware of allergy  . Morphine And Related Nausea And Vomiting  . Sulfa Antibiotics Other (See Comments)    Unknown  . Symbicort [Budesonide-Formoterol Fumarate] Other (See Comments)    Unknown  . Talc Other (See Comments)    Blood comes  out of fingernails  . Tape Hives    Social History   Socioeconomic History  . Marital status: Married    Spouse name: Not on file  . Number of children: Not on file  . Years of education: Not on file  . Highest education level: Not on file  Occupational History  . Not on file  Social Needs  . Financial resource strain: Not on file  . Food insecurity:    Worry: Not on file    Inability: Not on file  . Transportation needs:    Medical: Not on file    Non-medical: Not on file  Tobacco Use  . Smoking status: Never Smoker  . Smokeless tobacco: Never Used  Substance and Sexual Activity  . Alcohol use: No  . Drug use: No  . Sexual activity: Not Currently    Birth control/protection: Post-menopausal  Lifestyle  . Physical activity:    Days per week: Not on file    Minutes per session: Not on file  . Stress: Not on file  Relationships  . Social connections:    Talks on phone: Not on file    Gets together: Not on file    Attends religious service: Not on file    Active member of club or organization: Not on file    Attends meetings of clubs or organizations: Not on file    Relationship status: Not on file  . Intimate partner violence:    Fear of current or ex partner: Not on file    Emotionally abused: Not on file    Physically abused: Not on file    Forced sexual activity: Not on file  Other Topics Concern  . Not on file  Social History Narrative  . Not on file     Review of Systems: [y] = yes, [ ]  = no    General: Weight gain [] ; Weight loss [ ] ; Anorexia [ ] ; Fatigue [] ; Fever [ ] ; Chills [ ] ; Weakness Blue.Reese ]   Cardiac: Chest pain/pressure [ ] ; Resting SOB [ y]; Exertional SOB [] ; Orthopnea [ ] ; Pedal Edema [] ; Palpitations [ ] ; Syncope [ ] ; Presyncope [ ] ; Paroxysmal nocturnal dyspnea[ ]    Pulmonary: Cough [ ] ; Wheezing[ ] ; Hemoptysis[ ] ; Sputum [ ] ; Snoring [ ]    GI: Vomiting[ ] ; Dysphagia[ ] ; Melena[ ] ; Hematochezia [ ] ; Heartburn[ ] ; Abdominal pain [ ] ;  Constipation [ ] ; Diarrhea [ ] ; BRBPR [ ]    GU: Hematuria[ ] ; Dysuria [ ] ; Nocturia[ ]   Vascular: Pain in legs with walking [ ] ; Pain in feet with lying flat [ ] ; Non-healing sores [ ] ; Stroke [ ] ; TIA [ ] ; Slurred speech [ ] ;   Neuro: Headaches[ ] ; Vertigo[ ] ; Seizures[ ] ;  Paresthesias[ ] ;Blurred vision [ ] ; Diplopia [ ] ; Vision changes [ ]    Ortho/Skin: Arthritis [y]; Joint pain [y]; Muscle pain [ ] ; Joint swelling [ ] ; Back Pain [ ] ; Rash [ ]    Psych: Depression[ ] ; Anxiety[ ]    Heme: Bleeding problems [ ] ; Clotting disorders [ ] ; Anemia [ ]    Endocrine: Diabetes [ ] ; Thyroid dysfunction[ ]    Blood pressure (!) 151/82, pulse 98, weight 111 lb (50.3 kg), SpO2 99 %.  General: Thin elderly woman. No resp difficulty HEENT: normal Neck: supple. no JVD. Carotids 2+ bilat; no bruits. No lymphadenopathy or thryomegaly appreciated. Cor: PMI nondisplaced. Irregular rate & rhythm. No rubs, gallops or murmurs. Lungs: clear Abdomen: soft, nontender, nondistended. No hepatosplenomegaly. No bruits or masses. Good bowel sounds. Extremities: no cyanosis, clubbing, rash, edema Neuro: alert & orientedx3, cranial nerves grossly intact. moves all 4 extremities w/o difficulty. Affect odd. talkative  EKG not performed today  ASSESSMENT AND PLAN:   1. Chronic systolic heart failure - EF normal in 11/18 - Myoview 3/19 EF 33% - Echo 3/19 EF 30-35% - cath 4/19 non-obstructive CAD. No RHC performed - NYHA I. Volume status ok - I suspect she has NICM due to tachy-mediated CM in setting of AF with RVR but cannot exclude component of chemo toxicity but unlikely with EF normal in 10/18 - Will start amio 200 bid and plan DC-CV in several weeks - Given age and weight, decrease Eliquis to 2.5 bid  - Stop losartan. Start Entresto 49/51 in setting of HTN. Stop lasix - Continue carvedilol 3.125 bid - Check cMRI  2. PAF with RVR - As above, suspect tachy-mediated CM - Will start amio 200 bid and plan  DC-CV in several weeks - Given age and weight, decrease Eliquis to 2.5 bid   3. HTN - BP elevated. May be contributing to CM - Med adjustments as above  4. Memory issues - she seems to have quite some difficulty with remembering facts ot at least communicating them. I wonder if she has early dementia. - I called her daughter to discuss but did not get an email. I left VM for her to call back.   Total time spent > 60 minutes. Over half that time spent discussing above.   Glori Bickers, MD  10:51 PM

## 2017-09-14 NOTE — Telephone Encounter (Signed)
Entresto 49-51 mg, No PA required through [facility].

## 2017-09-19 ENCOUNTER — Telehealth (HOSPITAL_COMMUNITY): Payer: Self-pay | Admitting: Surgery

## 2017-09-19 ENCOUNTER — Telehealth: Payer: Self-pay | Admitting: Cardiovascular Disease

## 2017-09-19 NOTE — Telephone Encounter (Signed)
New Message    Patient is calling to find out how much liquid she should intake. Please call.

## 2017-09-19 NOTE — Telephone Encounter (Signed)
Monica Lyons called to clarify her fluid restriction.  I called her back and reviewed 1500-2000 cc fluid restriction.  I encouraged her to call back with any other concerns or questions related to her HF.

## 2017-09-20 ENCOUNTER — Telehealth (HOSPITAL_COMMUNITY): Payer: Self-pay | Admitting: *Deleted

## 2017-09-20 NOTE — Telephone Encounter (Signed)
Pt left a VM refusing to have CMRI done stating recently had one performed and that we need to check our records. I called patient to inform her we did not have a recent CMRI in her records and again she insisted that she had one done and did not want a repeat.

## 2017-09-21 ENCOUNTER — Encounter (HOSPITAL_COMMUNITY): Payer: Self-pay | Admitting: Cardiology

## 2017-09-21 ENCOUNTER — Other Ambulatory Visit (HOSPITAL_COMMUNITY): Payer: Self-pay

## 2017-09-21 ENCOUNTER — Telehealth (HOSPITAL_COMMUNITY): Payer: Self-pay | Admitting: Cardiology

## 2017-09-21 DIAGNOSIS — I480 Paroxysmal atrial fibrillation: Secondary | ICD-10-CM

## 2017-09-21 NOTE — Telephone Encounter (Signed)
Patient called to schedule DCCV as she has arranged transportation.   DCCV with Dr Haroldine Laws 09/27/17 @ 1 Patient arrival 4

## 2017-09-22 NOTE — Telephone Encounter (Signed)
This has been addrssed

## 2017-09-27 ENCOUNTER — Ambulatory Visit (HOSPITAL_COMMUNITY): Payer: Medicare Other | Admitting: Anesthesiology

## 2017-09-27 ENCOUNTER — Ambulatory Visit (HOSPITAL_COMMUNITY)
Admission: RE | Admit: 2017-09-27 | Discharge: 2017-09-27 | Disposition: A | Discharge status: Home / Self Care | Payer: Medicare Other | Source: Ambulatory Visit | Attending: Internal Medicine | Admitting: Internal Medicine

## 2017-09-27 ENCOUNTER — Encounter (HOSPITAL_COMMUNITY): Payer: Self-pay

## 2017-09-27 ENCOUNTER — Encounter (HOSPITAL_COMMUNITY)
Admission: RE | Disposition: A | Discharge status: Home / Self Care | Payer: Self-pay | Source: Ambulatory Visit | Attending: Internal Medicine

## 2017-09-27 ENCOUNTER — Other Ambulatory Visit: Payer: Self-pay

## 2017-09-27 DIAGNOSIS — I5022 Chronic systolic (congestive) heart failure: Secondary | ICD-10-CM | POA: Diagnosis not present

## 2017-09-27 DIAGNOSIS — Z887 Allergy status to serum and vaccine status: Secondary | ICD-10-CM | POA: Insufficient documentation

## 2017-09-27 DIAGNOSIS — I4891 Unspecified atrial fibrillation: Secondary | ICD-10-CM | POA: Diagnosis present

## 2017-09-27 DIAGNOSIS — J45909 Unspecified asthma, uncomplicated: Secondary | ICD-10-CM | POA: Insufficient documentation

## 2017-09-27 DIAGNOSIS — Z882 Allergy status to sulfonamides status: Secondary | ICD-10-CM | POA: Diagnosis not present

## 2017-09-27 DIAGNOSIS — I480 Paroxysmal atrial fibrillation: Secondary | ICD-10-CM | POA: Diagnosis not present

## 2017-09-27 DIAGNOSIS — Z886 Allergy status to analgesic agent status: Secondary | ICD-10-CM | POA: Diagnosis not present

## 2017-09-27 DIAGNOSIS — Z9221 Personal history of antineoplastic chemotherapy: Secondary | ICD-10-CM | POA: Diagnosis not present

## 2017-09-27 DIAGNOSIS — Z79899 Other long term (current) drug therapy: Secondary | ICD-10-CM | POA: Diagnosis not present

## 2017-09-27 DIAGNOSIS — Z91018 Allergy to other foods: Secondary | ICD-10-CM | POA: Insufficient documentation

## 2017-09-27 DIAGNOSIS — I110 Hypertensive heart disease with heart failure: Secondary | ICD-10-CM | POA: Diagnosis not present

## 2017-09-27 DIAGNOSIS — Z885 Allergy status to narcotic agent status: Secondary | ICD-10-CM | POA: Diagnosis not present

## 2017-09-27 DIAGNOSIS — Z7901 Long term (current) use of anticoagulants: Secondary | ICD-10-CM | POA: Insufficient documentation

## 2017-09-27 DIAGNOSIS — R413 Other amnesia: Secondary | ICD-10-CM | POA: Insufficient documentation

## 2017-09-27 DIAGNOSIS — Z853 Personal history of malignant neoplasm of breast: Secondary | ICD-10-CM | POA: Insufficient documentation

## 2017-09-27 DIAGNOSIS — Z881 Allergy status to other antibiotic agents status: Secondary | ICD-10-CM | POA: Insufficient documentation

## 2017-09-27 DIAGNOSIS — Z91010 Allergy to peanuts: Secondary | ICD-10-CM | POA: Insufficient documentation

## 2017-09-27 DIAGNOSIS — Z888 Allergy status to other drugs, medicaments and biological substances status: Secondary | ICD-10-CM | POA: Diagnosis not present

## 2017-09-27 DIAGNOSIS — M8580 Other specified disorders of bone density and structure, unspecified site: Secondary | ICD-10-CM | POA: Diagnosis not present

## 2017-09-27 DIAGNOSIS — Z91030 Bee allergy status: Secondary | ICD-10-CM | POA: Insufficient documentation

## 2017-09-27 HISTORY — PX: CARDIOVERSION: SHX1299

## 2017-09-27 SURGERY — CARDIOVERSION
Anesthesia: General

## 2017-09-27 MED ORDER — LIDOCAINE HCL (CARDIAC) PF 100 MG/5ML IV SOSY
PREFILLED_SYRINGE | INTRAVENOUS | Status: DC | PRN
  Administered 2017-09-27: 40 mg via INTRAVENOUS

## 2017-09-27 MED ORDER — SODIUM CHLORIDE 0.9 % IV SOLN
750.0000 mL | Freq: Once | INTRAVENOUS | Status: AC
  Administered 2017-09-27: 14:00:00 via INTRAVENOUS

## 2017-09-27 MED ORDER — PROPOFOL 10 MG/ML IV BOLUS
INTRAVENOUS | Status: DC | PRN
  Administered 2017-09-27: 50 mg via INTRAVENOUS

## 2017-09-27 NOTE — Interval H&P Note (Signed)
History and Physical Interval Note:  09/27/2017 1:28 PM  Monica Lyons  has presented today for surgery, with the diagnosis of a fib  The various methods of treatment have been discussed with the patient and family. After consideration of risks, benefits and other options for treatment, the patient has consented to  Procedure(s): CARDIOVERSION (N/A) as a surgical intervention .  The patient's history has been reviewed, patient examined, no change in status, stable for surgery.  I have reviewed the patient's chart and labs.  Questions were answered to the patient's satisfaction.     Daniel Bensimhon

## 2017-09-27 NOTE — Anesthesia Preprocedure Evaluation (Addendum)
Anesthesia Evaluation  Patient identified by MRN, date of birth, ID band Patient awake    Reviewed: Allergy & Precautions, NPO status , Patient's Chart, lab work & pertinent test results, reviewed documented beta blocker date and time   Airway Mallampati: III  TM Distance: >3 FB Neck ROM: Full    Dental no notable dental hx. (+) Teeth Intact   Pulmonary asthma ,    Pulmonary exam normal breath sounds clear to auscultation       Cardiovascular hypertension, Pt. on medications and Pt. on home beta blockers + CAD  + dysrhythmias Atrial Fibrillation  Rhythm:Irregular Rate:Normal  Cardiac Cath 07/11/2017-  Mid RCA lesion is 60% stenosed.  Prox RCA to Mid RCA lesion is 30% stenosed.  Prox RCA lesion is 25% stenosed.  Ost LAD to Prox LAD lesion is 50% stenosed.   Neuro/Psych negative neurological ROS  negative psych ROS   GI/Hepatic negative GI ROS, Neg liver ROS,   Endo/Other  Hx/o breast ca  Renal/GU Renal InsufficiencyRenal disease  negative genitourinary   Musculoskeletal osteopenia   Abdominal   Peds  Hematology Eliquis therapy-last dose this am   Anesthesia Other Findings   Reproductive/Obstetrics                         Anesthesia Physical Anesthesia Plan  ASA: III  Anesthesia Plan: General   Post-op Pain Management:    Induction: Intravenous  PONV Risk Score and Plan: 3 and Ondansetron, Propofol infusion and Treatment may vary due to age or medical condition  Airway Management Planned: Mask, Natural Airway and Nasal Cannula  Additional Equipment:   Intra-op Plan:   Post-operative Plan:   Informed Consent: I have reviewed the patients History and Physical, chart, labs and discussed the procedure including the risks, benefits and alternatives for the proposed anesthesia with the patient or authorized representative who has indicated his/her understanding and acceptance.      Plan Discussed with: CRNA and Surgeon  Anesthesia Plan Comments:         Anesthesia Quick Evaluation

## 2017-09-27 NOTE — CV Procedure (Signed)
    DIRECT CURRENT CARDIOVERSION  NAME:  Monica Lyons   MRN: [id number] DOB:  1932-11-20   ADMIT DATE: 09/27/2017   INDICATIONS: Atrial fibrillation    PROCEDURE:   Informed consent was obtained prior to the procedure. The risks, benefits and alternatives for the procedure were discussed and the patient comprehended these risks. Once an appropriate time out was taken, the patient had the defibrillator pads placed in the anterior and posterior position. The patient then underwent sedation by the anesthesia service. Once an appropriate level of sedation was achieved, the patient received a single biphasic, synchronized 150J shock with prompt conversion to sinus rhythm. No apparent complications.  Glori Bickers, MD  1:41 PM

## 2017-09-27 NOTE — Discharge Instructions (Signed)
Electrical Cardioversion, Care After °This sheet gives you information about how to care for yourself after your procedure. Your health care provider may also give you more specific instructions. If you have problems or questions, contact your health care provider. °What can I expect after the procedure? °After the procedure, it is common to have: °· Some redness on the skin where the shocks were given. ° °Follow these instructions at home: °· Do not drive for 24 hours if you were given a medicine to help you relax (sedative). °· Take over-the-counter and prescription medicines only as told by your health care provider. °· Ask your health care provider how to check your pulse. Check it often. °· Rest for 48 hours after the procedure or as told by your health care provider. °· Avoid or limit your caffeine use as told by your health care provider. °Contact a health care provider if: °· You feel like your heart is beating too quickly or your pulse is not regular. °· You have a serious muscle cramp that does not go away. °Get help right away if: °· You have discomfort in your chest. °· You are dizzy or you feel faint. °· You have trouble breathing or you are short of breath. °· Your speech is slurred. °· You have trouble moving an arm or leg on one side of your body. °· Your fingers or toes turn cold or blue. °This information is not intended to replace advice given to you by your health care provider. Make sure you discuss any questions you have with your health care provider. °Document Released: 01/10/2013 Document Revised: 10/24/2015 Document Reviewed: 09/26/2015 °Elsevier Interactive Patient Education © 2018 [employer] ° °

## 2017-09-27 NOTE — Transfer of Care (Signed)
Immediate Anesthesia Transfer of Care Note  Patient: Monica Lyons  Procedure(s) Performed: CARDIOVERSION (N/A )  Patient Location: Endoscopy Unit  Anesthesia Type:MAC  Level of Consciousness: awake, alert  and oriented  Airway & Oxygen Therapy: Patient Spontanous Breathing and Patient connected to nasal cannula oxygen  Post-op Assessment: Report given to RN and Post -op Vital signs reviewed and stable  Post vital signs: Reviewed and stable  Last Vitals:  Vitals Value Taken Time  BP    Temp    Pulse    Resp    SpO2      Last Pain:  Vitals:   09/27/17 1134  TempSrc: Oral  PainSc: 0-No pain         Complications: No apparent anesthesia complications

## 2017-09-27 NOTE — Anesthesia Postprocedure Evaluation (Signed)
Anesthesia Post Note  Patient: Monica Lyons  Procedure(s) Performed: CARDIOVERSION (N/A )     Patient location during evaluation: PACU Anesthesia Type: General Level of consciousness: awake and alert Pain management: pain level controlled Vital Signs Assessment: post-procedure vital signs reviewed and stable Respiratory status: spontaneous breathing, nonlabored ventilation and respiratory function stable Cardiovascular status: blood pressure returned to baseline and stable Postop Assessment: no apparent nausea or vomiting Anesthetic complications: no    Last Vitals:  Vitals:   09/27/17 1134 09/27/17 1342  BP: (!) 184/88 (!) 174/65  Pulse: 73 71  Resp: (!) 21 (!) 30  Temp: 37.1 C 36.6 C  SpO2: 98% 100%    Last Pain:  Vitals:   09/27/17 1342  TempSrc: Oral  PainSc: 0-No pain                 [name]

## 2017-09-27 NOTE — Anesthesia Procedure Notes (Signed)
Procedure Name: MAC Date/Time: 09/27/2017 1:43 PM Performed by: Teressa Lower., CRNA Pre-anesthesia Checklist: Patient identified, Emergency Drugs available, Suction available, Patient being monitored and Timeout performed Patient Re-evaluated:Patient Re-evaluated prior to induction Oxygen Delivery Method: Nasal cannula

## 2017-09-28 ENCOUNTER — Encounter (HOSPITAL_COMMUNITY): Payer: Self-pay | Admitting: Internal Medicine

## 2017-09-28 ENCOUNTER — Telehealth: Payer: Self-pay | Admitting: Cardiovascular Disease

## 2017-09-28 NOTE — Telephone Encounter (Signed)
Spoke with pt and pt had cardioversion done yesterday and was calling because while pt was asleep this am was awakened to a kicking sensation up and down left side that lasted for a short period no other symptoms . Will forward to Dr Gwenlyn Found for review .Adonis Housekeeper

## 2017-09-28 NOTE — Telephone Encounter (Signed)
New Message:       Pt states she had a cardiovascular done on yesterday. Pt states late last night she experienced a kicking on her left side on the inside and she wants to know if that was normal.

## 2017-09-29 NOTE — Telephone Encounter (Signed)
Not really sure what that symptom is.  If she has recurrent symptoms she can come in and see a mid-level provider.

## 2017-09-30 NOTE — Telephone Encounter (Signed)
Spoke with pt, aware of dr berry's recommendation. She is feeling fine today no problems.

## 2017-10-25 ENCOUNTER — Telehealth (HOSPITAL_COMMUNITY): Payer: Self-pay | Admitting: *Deleted

## 2017-10-25 NOTE — Telephone Encounter (Signed)
Pt left VM reuqesting a call back to clarify her eliquis dose. She stated she has been taking 5mg  bid but noticed her prescription was now written for 2.5mg  bid. Per Dr.Bensimhons last office note on 09/14/17 "Given age and weight, decrease Eliquis to 2.5 bid". Attempted to reach pt by phone no answer/ no voicemail.

## 2017-11-01 ENCOUNTER — Ambulatory Visit (INDEPENDENT_AMBULATORY_CARE_PROVIDER_SITE_OTHER): Payer: Medicare Other | Admitting: Cardiovascular Disease

## 2017-11-01 ENCOUNTER — Encounter: Payer: Self-pay | Admitting: Cardiovascular Disease

## 2017-11-01 VITALS — BP 116/72 | HR 56 | Ht 66.0 in | Wt 110.0 lb

## 2017-11-01 DIAGNOSIS — I10 Essential (primary) hypertension: Secondary | ICD-10-CM | POA: Diagnosis not present

## 2017-11-01 DIAGNOSIS — I519 Heart disease, unspecified: Secondary | ICD-10-CM | POA: Diagnosis not present

## 2017-11-01 DIAGNOSIS — I2510 Atherosclerotic heart disease of native coronary artery without angina pectoris: Secondary | ICD-10-CM

## 2017-11-01 NOTE — Assessment & Plan Note (Signed)
History of PAF maintaining sinus rhythm on amiodarone, Eliquis status post outpatient DC cardioversion by Dr. Haroldine Laws 09/27/2017 successfully.  She feels clinically improved.

## 2017-11-01 NOTE — Assessment & Plan Note (Signed)
History of noncritical CAD artery catheterization which I performed 07/11/2017.

## 2017-11-01 NOTE — Assessment & Plan Note (Signed)
Combined systolic and diastolic heart failure with ejection fraction percent 2D echocardiography performed 07/01/2017.  She is on optimal medical therapy including Entresto and carvedilol.  I suspect her EF has improved based on her symptoms.

## 2017-11-01 NOTE — Progress Notes (Signed)
11/01/2017 Monica Lyons   1932-11-07  [id number]  Primary Physician Secundino Ginger, PA-C Primary Cardiologist: Lorretta Harp MD Monica Lyons, [place]  HPI:  Monica Lyons is a 84 y.o.  widowed Caucasian female mother of 2, grandmother of 7 grandchildren who was referred by Roque Cash for cardiovascular evaluation treatment of congestive heart failure.  I last saw her in the office 08/02/2017.Marland Kitchen  She had a 2-D echocardiogram performed 02/14/17 which was essentially normal with a RV end-diastolic pressure of 40 mmHg. She's had increasing dyspnea and lower extreme edema over the last several months. She's never had a heart attack or stroke. A recent 2-D echo performed several days ago showed similar findings as the previous one. She was prescribed carvedilol which she did not fill as well as low-dose Lasix which has improved her symptoms and lower shin edema. BNP was measured at 900.  I performed outpatient cardiac catheterization on her radially 07/11/2017 revealing noncritical CAD addressing that she had a nonischemic cardiomyopathy.  Clinically improved with rate control, oral diuretics is on Eliquis oral anticoagulation stroke prophylaxis. I referred her to Dr. Haroldine Laws in the advanced heart failure clinic for further evaluation.  She underwent outpatient DC cardioversion by Dr. Haroldine Laws 09/27/2017 and feels clinically improved.  She is in sinus rhythm today on amiodarone, Entresto, and Eliquis.     Current Meds  Medication Sig  . albuterol (PROVENTIL HFA;VENTOLIN HFA) 108 (90 BASE) MCG/ACT inhaler Inhale 2 puffs into the lungs every 6 (six) hours as needed for wheezing or shortness of breath.   Marland Kitchen amiodarone (PACERONE) 200 MG tablet Take 1 tablet (200 mg total) by mouth daily.  Marland Kitchen apixaban (ELIQUIS) 2.5 MG TABS tablet Take 1 tablet (2.5 mg total) by mouth 2 (two) times daily. (Patient taking differently: Take 5 mg by mouth 2 (two) times daily. )  . benzonatate (TESSALON)  100 MG capsule Take 100 mg by mouth 3 (three) times daily as needed for cough.  . carvedilol (COREG) 6.25 MG tablet Take 0.5 tablets (3.125 mg total) by mouth 2 (two) times daily with a meal.  . Cholecalciferol (VITAMIN D3) 2000 units TABS Take 2,000 Units by mouth daily.   Marland Kitchen EPINEPHrine 0.3 mg/0.3 mL IJ SOAJ injection Inject 0.3 mg into the muscle once.   . fexofenadine (ALLEGRA) 180 MG tablet Take 180 mg by mouth daily as needed for allergies.   . fluticasone (FLOVENT HFA) 110 MCG/ACT inhaler Inhale 1 puff into the lungs 2 (two) times daily.  . sacubitril-valsartan (ENTRESTO) 49-51 MG Take 1 tablet by mouth 2 (two) times daily.  . vitamin B-12 (CYANOCOBALAMIN) 500 MCG tablet Take 500 mcg by mouth 2 (two) times daily.     Allergies  Allergen Reactions  . Aspirin Anaphylaxis  . Bee Venom Anaphylaxis  . Erythromycin Anaphylaxis  . Other Anaphylaxis and Other (See Comments)    [place]. CAUSES THROAT TO SWELL SHUT!!!  . Nutmeg Oil (Myristica Oil) Nausea And Vomiting  . Zoster Vaccine Live Other (See Comments)    Mycin family  . Amlodipine Besylate Other (See Comments)    Muscle cramps - pt unaware of allergy  . Chlorthalidone Other (See Comments)    Hyponatremia - pt unaware of allergy  . Diltiazem Hcl Other (See Comments)    Syncope - pt unaware of allergy  . Morphine And Related Nausea And Vomiting  . Sulfa Antibiotics Nausea And Vomiting  . Symbicort [Budesonide-Formoterol Fumarate] Other (See Comments)    Unknown  .  Talc Other (See Comments)    Blood comes out of fingernails  . Tape Hives    Social History   Socioeconomic History  . Marital status: Married    Spouse name: Not on file  . Number of children: Not on file  . Years of education: Not on file  . Highest education level: Not on file  Occupational History  . Not on file  Social Needs  . Financial resource strain: Not on file  . Food insecurity:    Worry: Not on file    Inability: Not on file  . Transportation  needs:    Medical: Not on file    Non-medical: Not on file  Tobacco Use  . Smoking status: Never Smoker  . Smokeless tobacco: Never Used  Substance and Sexual Activity  . Alcohol use: No  . Drug use: No  . Sexual activity: Not Currently    Birth control/protection: Post-menopausal  Lifestyle  . Physical activity:    Days per week: Not on file    Minutes per session: Not on file  . Stress: Not on file  Relationships  . Social connections:    Talks on phone: Not on file    Gets together: Not on file    Attends religious service: Not on file    Active member of club or organization: Not on file    Attends meetings of clubs or organizations: Not on file    Relationship status: Not on file  . Intimate partner violence:    Fear of current or ex partner: Not on file    Emotionally abused: Not on file    Physically abused: Not on file    Forced sexual activity: Not on file  Other Topics Concern  . Not on file  Social History Narrative  . Not on file     Review of Systems: General: negative for chills, fever, night sweats or weight changes.  Cardiovascular: negative for chest pain, dyspnea on exertion, edema, orthopnea, palpitations, paroxysmal nocturnal dyspnea or shortness of breath Dermatological: negative for rash Respiratory: negative for cough or wheezing Urologic: negative for hematuria Abdominal: negative for nausea, vomiting, diarrhea, bright red blood per rectum, melena, or hematemesis Neurologic: negative for visual changes, syncope, or dizziness All other systems reviewed and are otherwise negative except as noted above.    Blood pressure 116/72, pulse (!) 56, height 5\' 6"  (1.676 m), weight 110 lb (49.9 kg).  General appearance: alert and no distress Neck: no adenopathy, no carotid bruit, no JVD, supple, symmetrical, trachea midline and thyroid not enlarged, symmetric, no tenderness/mass/nodules Lungs: clear to auscultation bilaterally Heart: regular rate and  rhythm, S1, S2 normal, no murmur, click, rub or gallop Extremities: extremities normal, atraumatic, no cyanosis or edema Pulses: 2+ and symmetric Skin: Skin color, texture, turgor normal. No rashes or lesions Neurologic: Alert and oriented X 3, normal strength and tone. Normal symmetric reflexes. Normal coordination and gait  EKG sinus bradycardia 45 occasional PACs and right bundle branch block.  I personally reviewed this EKG.  ASSESSMENT AND PLAN:   Hypertension History of essential hypertension her blood pressure measured today at 116/72.  She is on carvedilol.  Paroxysmal atrial fibrillation (HCC) History of PAF maintaining sinus rhythm on amiodarone, Eliquis status post outpatient DC cardioversion by Dr. Haroldine Laws 09/27/2017 successfully.  She feels clinically improved.  Acute combined systolic and diastolic heart failure (HCC) Combined systolic and diastolic heart failure with ejection fraction percent 2D echocardiography performed 07/01/2017.  She is on optimal  medical therapy including Entresto and carvedilol.  I suspect her EF has improved based on her symptoms.  Coronary artery disease History of noncritical CAD artery catheterization which I performed 07/11/2017.      Lorretta Harp MD FACP,FACC,FAHA, [facility] 11/01/2017 12:31 PM

## 2017-11-01 NOTE — Patient Instructions (Signed)
Medication Instructions:  Your physician recommends that you continue on your current medications as directed. Please refer to the Current Medication list given to you today.   Labwork: none  Testing/Procedures: Your physician has requested that you have an echocardiogram. Echocardiography is a painless test that uses sound waves to create images of your heart. It provides your doctor with information about the size and shape of your heart and how well your heart's chambers and valves are working. This procedure takes approximately one hour. There are no restrictions for this procedure.    Follow-Up: Your physician wants you to follow-up in: 6 months with Dr. Berry. You will receive a reminder letter in the mail two months in advance. If you don't receive a letter, please call our office to schedule the follow-up appointment.   Any Other Special Instructions Will Be Listed Below (If Applicable).     If you need a refill on your cardiac medications before your next appointment, please call your pharmacy.   

## 2017-11-01 NOTE — Assessment & Plan Note (Signed)
History of essential hypertension her blood pressure measured today at 116/72.  She is on carvedilol.

## 2017-11-10 ENCOUNTER — Other Ambulatory Visit: Payer: Self-pay

## 2017-11-10 ENCOUNTER — Ambulatory Visit (HOSPITAL_COMMUNITY): Payer: Medicare Other | Attending: Cardiovascular Disease

## 2017-11-10 DIAGNOSIS — I071 Rheumatic tricuspid insufficiency: Secondary | ICD-10-CM | POA: Insufficient documentation

## 2017-11-10 DIAGNOSIS — I110 Hypertensive heart disease with heart failure: Secondary | ICD-10-CM | POA: Diagnosis not present

## 2017-11-10 DIAGNOSIS — I2510 Atherosclerotic heart disease of native coronary artery without angina pectoris: Secondary | ICD-10-CM | POA: Insufficient documentation

## 2017-11-10 DIAGNOSIS — I519 Heart disease, unspecified: Secondary | ICD-10-CM | POA: Diagnosis not present

## 2017-11-10 DIAGNOSIS — I4891 Unspecified atrial fibrillation: Secondary | ICD-10-CM | POA: Diagnosis not present

## 2017-11-10 DIAGNOSIS — I10 Essential (primary) hypertension: Secondary | ICD-10-CM | POA: Diagnosis not present

## 2017-11-10 DIAGNOSIS — I509 Heart failure, unspecified: Secondary | ICD-10-CM | POA: Diagnosis not present

## 2017-11-10 DIAGNOSIS — I119 Hypertensive heart disease without heart failure: Secondary | ICD-10-CM | POA: Diagnosis present

## 2017-11-11 ENCOUNTER — Telehealth: Payer: Self-pay | Admitting: Physician Assistant

## 2017-11-11 NOTE — Telephone Encounter (Signed)
Recieved a page regarding need to clarify med instructions.   Tried several times to contact pt and got no answer, no opportunity to leave a message.   Rosaria Ferries, PA-C 11/10/2017 7:57 PM Beeper [phone]

## 2017-11-14 ENCOUNTER — Encounter: Payer: Self-pay | Admitting: *Deleted

## 2017-11-15 ENCOUNTER — Encounter (HOSPITAL_COMMUNITY): Payer: Medicare Other | Admitting: Internal Medicine

## 2017-12-02 ENCOUNTER — Other Ambulatory Visit (HOSPITAL_COMMUNITY): Payer: Self-pay | Admitting: Internal Medicine

## 2017-12-08 ENCOUNTER — Telehealth: Payer: Self-pay | Admitting: Cardiovascular Disease

## 2017-12-08 NOTE — Telephone Encounter (Signed)
New Message:   Patient calling having concern about some medication. Please call back.

## 2017-12-08 NOTE — Telephone Encounter (Signed)
Spoke with pt, patient is asking when she would need to have her lab work checked while taking entresto. Explained would need to have lab work checked about 1 month after starting and then every 6 mo to 1 yr. She is going to contact he medical doctor about getting labs checked because they are closer to her. All of her questions regarding entresto answered.

## 2017-12-09 ENCOUNTER — Other Ambulatory Visit: Payer: Self-pay | Admitting: Cardiovascular Disease

## 2017-12-09 MED ORDER — APIXABAN 2.5 MG PO TABS: 2.5000 mg | ORAL_TABLET | Freq: Two times a day (BID) | ORAL | 6 refills | Status: DC

## 2017-12-09 NOTE — Telephone Encounter (Signed)
°*  STAT* If patient is at the pharmacy, call can be transferred to refill team.   1. Which medications need to be refilled? (please list name of each medication and dose if known)  New prescription for Eliquis 2.5mg  2 times a day and Carvedilol 3.125 mg  2times  2. Which pharmacy/location (including street and city if local pharmacy) is medication to be sent to? [facility] [phone]  3. Do they need a 30 day or 90 day supply? 60 for Eliquis and 60 for Carvedilol and refills

## 2017-12-13 ENCOUNTER — Telehealth: Payer: Self-pay | Admitting: Cardiovascular Disease

## 2017-12-13 NOTE — Telephone Encounter (Signed)
Returned call to patient, she states she got her echo in the mail and noticed something was wrong with her left ventricle.   She was wondering if it matters if she sleeps on her or left or right side at night.    Per echo: Left ventricle: The cavity size was normal. Systolic function was   normal. The estimated ejection fraction was in the range of 60%   to 65%. Wall motion was normal; there were no regional wall   motion abnormalities. Doppler parameters are consistent with   abnormal left ventricular relaxation (grade 1 diastolic   dysfunction). Doppler parameters are consistent with high   ventricular filling pressure.   Reviewed results with patient and advised sleeping position will not affect this.   Patient verbalized understanding.

## 2017-12-13 NOTE — Telephone Encounter (Signed)
New Message:    Patient has follow up questions about  her Echo

## 2017-12-20 ENCOUNTER — Other Ambulatory Visit: Payer: Self-pay | Admitting: *Deleted

## 2017-12-20 MED ORDER — CARVEDILOL 3.125 MG PO TABS: 3.1250 mg | ORAL_TABLET | Freq: Two times a day (BID) | ORAL | 3 refills | Status: DC

## 2017-12-21 ENCOUNTER — Telehealth: Payer: Self-pay | Admitting: Cardiovascular Disease

## 2017-12-21 NOTE — Telephone Encounter (Signed)
Patient reports refilling her medication yesterday for carvedilol and is concerned due to the dosing and administration reading 3.125 mg twice daily. Patient reports she has been cutting her 3.125 mg tablets of carvedilol in half since March 2019 as she was told to do by our office for a total of 3.125 mg daily. The telephone notes I have reviewed from 06/24/17 state she was only cutting her 6.25 mg in half. She is concerned due to having severe light sensitivity when taking the 3.125 mg twice daily before and wants to make sure this is not what is being reccommended. She also reports lower extremity swelling and a blue color in her legs 2 weeks ago, she began walking and this went away doesn't seem to be an issue now but would like Dr. Gwenlyn Found to be aware. Will forward to Dr. Gwenlyn Found for proper dosing and appropriate administration recommended. Patient informed we will call her back with an answer.

## 2017-12-21 NOTE — Telephone Encounter (Signed)
New Message:       Pt c/o medication issue:  1. Name of Medication: carvedilol (COREG) 3.125 MG tablet  2. How are you currently taking this medication (dosage and times per day)? Take 1 tablet (3.125 mg total) by mouth 2 (two) times daily with a meal.  3. Are you having a reaction (difficulty breathing--STAT)? No  4. What is your medication issue? Pt states she is calling to inform us that she is taking half of the 3.125. She states everything is too bright and her feet and legs have been turning blue

## 2017-12-21 NOTE — Telephone Encounter (Signed)
Tried to call pt and phone just keeps ringing will have to try again later

## 2017-12-21 NOTE — Telephone Encounter (Signed)
He is to follow-up with her PCP for the symptoms.

## 2017-12-22 NOTE — Telephone Encounter (Signed)
Have patient come in to see Cyril Mourning to go over medications and proper dosing.

## 2017-12-22 NOTE — Telephone Encounter (Signed)
I called patient, she states she is unable to drive here, she has people drive her and her driver is out of town for 5 weeks.  I tried to get her scheduled, but she would like to talk to PharmD first, before making appointment.   Could you please contact patient and discuss with her regarding the medication.   Thank you!

## 2017-12-22 NOTE — Telephone Encounter (Signed)
Please advise if okay for patient to continue cutting the 3.125 Coreg dose in half taking BID.   Thank you.

## 2017-12-22 NOTE — Telephone Encounter (Signed)
Called patient son, per DPR okay to leave message. Advised that we needed to have her see pharmacist, left call back number to call to schedule.

## 2017-12-23 ENCOUNTER — Telehealth: Payer: Self-pay | Admitting: Cardiovascular Disease

## 2017-12-23 MED ORDER — CARVEDILOL 3.125 MG PO TABS: 1.5625 mg | ORAL_TABLET | Freq: Two times a day (BID) | ORAL | 3 refills | Status: DC

## 2017-12-23 NOTE — Addendum Note (Signed)
Addended by: Rockne Menghini on: 12/23/2017 11:46 AM   Modules accepted: Orders

## 2017-12-23 NOTE — Telephone Encounter (Signed)
Spoke with patient.  She seemed confused about all the attention to her medication.  Stated she had no issues, other than to clarify that she can tolerate no more than 1/2 of the 3.125 mg tablet twice daily.  Chart updated to note this.

## 2017-12-23 NOTE — Telephone Encounter (Signed)
Returned call to patient, advised I spoke with pharmD and they will call to speak with her in regards to medications (see previous telephone note) prior to the end of the day.   She is aware and verbalized understanding.

## 2017-12-23 NOTE — Telephone Encounter (Signed)
New Message:    Patient has some concerns about som medication. Not sure what she should be taking.

## 2018-01-06 ENCOUNTER — Other Ambulatory Visit (HOSPITAL_COMMUNITY): Payer: Self-pay

## 2018-01-06 MED ORDER — SACUBITRIL-VALSARTAN 49-51 MG PO TABS: 1.0000 | ORAL_TABLET | Freq: Two times a day (BID) | ORAL | 5 refills | Status: DC

## 2018-02-21 ENCOUNTER — Telehealth: Payer: Self-pay | Admitting: Cardiovascular Disease

## 2018-02-21 NOTE — Telephone Encounter (Signed)
Internal bleeding will show symptoms like increased weakness and fatigue, black tarry stool, blood in sputum, and/or blood in urine.   We can draw CBC and BMP this week to assess hemoblobin if willing but based for further assessment.

## 2018-02-21 NOTE — Telephone Encounter (Signed)
New Message   Pt c/o medication issue:  1. Name of Medication: Entresto and Eliquis  2. How are you currently taking this medication (dosage and times per day)?   3. Are you having a reaction (difficulty breathing--STAT)?   4. What is your medication issue? Patient is calling because she takes Eliquis and she has been bleeding profusely from any type of cut. She states that she wants the cardiologist to be aware. She states that she is not bleeding now. But if she touches anything she cuts herself and then starts bleeding.

## 2018-02-21 NOTE — Telephone Encounter (Signed)
Returned call to patient Monica Lyons's advice given.She stated she is not having any signs of internal bleeding just both hands and fingers bleeding and bleeding under skin.Monica Lyons advised to use cream on hands to keep moist.Stated she still thinks she is having a reaction from [place].Stated she only wants Dr.Berry's advice.Message sent to [facility].

## 2018-02-21 NOTE — Telephone Encounter (Signed)
Returned call to patient she stated her hands and fingers have been bleeding for the past 2 days.Stated different spots on hands and fingers start bleeding.She has not injured hands or fingers.Stated she is allergic to talc.Stated entresto has talc and she thinks she is having allergic reaction.Stated she is allergic to talc.She is concerned she may be bleeding internally.No bleeding noticed any where but hands and fingers.Message sent to pharmacy for advice.

## 2018-02-21 NOTE — Telephone Encounter (Signed)
I have never heard of this is a complication.  Have her come in to see Raquel for evaluation of the medication side effect.

## 2018-02-22 NOTE — Telephone Encounter (Signed)
Spoke with pt, she is fine today and not having any problems. Patient voiced understanding of the need for the blood thinner and I encouraged the patient to be careful and make sure to keep hands hydrated with lotion. She will call if she has any other problems.

## 2018-02-27 ENCOUNTER — Telehealth: Payer: Self-pay | Admitting: Cardiovascular Disease

## 2018-02-27 NOTE — Telephone Encounter (Signed)
Patient called regrding her prescription for Entresto.  Sh estates she did not feel comfortable taking this medication due to an article she read about Dr. Haroldine Laws.  Please call .

## 2018-02-27 NOTE — Telephone Encounter (Signed)
Spoke to pt for very long time and jest of conversation is that Dr Haroldine Laws is on script for [facility] and pt received a newlsetter stating that Dr Haroldine Laws received large amount of money from  pharmecutical co and is wandering if needs 2 cardiologist. Pt is also wanting only to see Dr Gwenlyn Found and have only him on refills.Explained to pt that some pt's have multiple cardiologist pending on dx .Will forward to Dr Gwenlyn Found for review .Adonis Housekeeper

## 2018-02-28 NOTE — Telephone Encounter (Signed)
Patient has called and wants to know the name of the doctor that "zapped" her heart.

## 2018-02-28 NOTE — Telephone Encounter (Signed)
Spoke with pt.  Adv pt that her most recent dccv was performed by Dr.Benshimon. Pt sts that she received a letter that she will need to call our office to schedule an appt. Pt is due for f/u with Dr.Berry for Jan 2020. appt sch for 04/11/18 @ 2:45pm with Dr.Berry. Pt aware of appt and voiced appreciation for the assistance.

## 2018-04-11 ENCOUNTER — Ambulatory Visit: Payer: Medicare Other | Admitting: Cardiovascular Disease

## 2018-04-17 ENCOUNTER — Telehealth: Payer: Self-pay | Admitting: Cardiovascular Disease

## 2018-04-17 NOTE — Telephone Encounter (Signed)
Per chart review, patient's cardiac history does not meet criteria for SBE prophylaxis. Patient notified.

## 2018-04-17 NOTE — Telephone Encounter (Signed)
Patient would like to know if she needs to be  on antibiotics prior to getting a dental cleaning.   Dr. Tito Dine [phone] or [phone] or [phone]. Patient is not sure of number.

## 2018-05-05 ENCOUNTER — Encounter: Payer: Self-pay | Admitting: Cardiovascular Disease

## 2018-05-05 ENCOUNTER — Ambulatory Visit (INDEPENDENT_AMBULATORY_CARE_PROVIDER_SITE_OTHER): Payer: Medicare Other | Admitting: Cardiovascular Disease

## 2018-05-05 DIAGNOSIS — I480 Paroxysmal atrial fibrillation: Secondary | ICD-10-CM

## 2018-05-05 DIAGNOSIS — I2510 Atherosclerotic heart disease of native coronary artery without angina pectoris: Secondary | ICD-10-CM

## 2018-05-05 DIAGNOSIS — I10 Essential (primary) hypertension: Secondary | ICD-10-CM

## 2018-05-05 DIAGNOSIS — I5189 Other ill-defined heart diseases: Secondary | ICD-10-CM

## 2018-05-05 DIAGNOSIS — I519 Heart disease, unspecified: Secondary | ICD-10-CM | POA: Diagnosis not present

## 2018-05-05 NOTE — Assessment & Plan Note (Signed)
History of noncritical CAD by cardiac catheterization which I performed 07/11/2017.  This revealed a 50% proximal LAD and a 60% mid to distal RCA stenosis.  These have been treated medically.  She denies chest pain.

## 2018-05-05 NOTE — Progress Notes (Signed)
05/05/2018 [place]   1932/12/12  [id number]  Primary Physician Secundino Ginger, PA-C Primary Cardiologist: Lorretta Harp MD [name], [place]  HPI:  Monica Lyons is a 85 y.o.  widowed Caucasian female mother of 2, grandmother of 10 grandchildrenwhowas referred by Roque Cash for cardiovascular evaluation treatment of congestive heart failure.I last saw her in the office  11/01/2017. She had a 2-D echocardiogram performed 02/14/17 which was essentially normal with a RV end-diastolic pressure of 40 mmHg. She's had increasing dyspnea and lower extreme edema over the last several months. She's never had a heart attack or stroke. A recent 2-D echo performed several days ago showed similar findings as the previous one. She was prescribed carvedilol which she did not fill as well as low-dose Lasix which has improved her symptoms and lower shin edema. BNP was measured at 900. I performed outpatient cardiac catheterization on her radially 07/11/2017 revealing noncritical CAD addressing that she had a nonischemic cardiomyopathy. Clinically improved with rate control, oral diuretics is on Eliquis oral anticoagulation stroke prophylaxis. I referred her to Dr. Haroldine Laws in the advanced heart failure clinic for further evaluation.  She underwent outpatient DC cardioversion by Dr. Haroldine Laws 09/27/2017 and feels clinically improved.  She is in sinus rhythm today on amiodarone, Entresto, and Eliquis.  Since I saw her 6 months ago she is remained remarkably stable maintaining sinus rhythm.  Her 2D echo performed 11/08/2017 revealed normal LV systolic function.  She denies chest pain or shortness of breath.  No outpatient medications have been marked as taking for the 05/05/18 encounter (Office Visit) with Lorretta Harp, MD.     Allergies  Allergen Reactions  . Aspirin Anaphylaxis  . Bee Venom Anaphylaxis  . Erythromycin Anaphylaxis  . Other Anaphylaxis and Other (See  Comments)    [place]. CAUSES THROAT TO SWELL SHUT!!!  . Nutmeg Oil (Myristica Oil) Nausea And Vomiting  . Zoster Vaccine Live Other (See Comments)    Mycin family  . Amlodipine Besylate Other (See Comments)    Muscle cramps - pt unaware of allergy  . Chlorthalidone Other (See Comments)    Hyponatremia - pt unaware of allergy  . Diltiazem Hcl Other (See Comments)    Syncope - pt unaware of allergy  . Morphine And Related Nausea And Vomiting  . Sulfa Antibiotics Nausea And Vomiting  . Symbicort [Budesonide-Formoterol Fumarate] Other (See Comments)    Unknown  . Talc Other (See Comments)    Blood comes out of fingernails  . Tape Hives    Social History   Socioeconomic History  . Marital status: Married    Spouse name: Not on file  . Number of children: Not on file  . Years of education: Not on file  . Highest education level: Not on file  Occupational History  . Not on file  Social Needs  . Financial resource strain: Not on file  . Food insecurity:    Worry: Not on file    Inability: Not on file  . Transportation needs:    Medical: Not on file    Non-medical: Not on file  Tobacco Use  . Smoking status: Never Smoker  . Smokeless tobacco: Never Used  Substance and Sexual Activity  . Alcohol use: No  . Drug use: No  . Sexual activity: Not Currently    Birth control/protection: Post-menopausal  Lifestyle  . Physical activity:    Days per week: Not on file    Minutes per  session: Not on file  . Stress: Not on file  Relationships  . Social connections:    Talks on phone: Not on file    Gets together: Not on file    Attends religious service: Not on file    Active member of club or organization: Not on file    Attends meetings of clubs or organizations: Not on file    Relationship status: Not on file  . Intimate partner violence:    Fear of current or ex partner: Not on file    Emotionally abused: Not on file    Physically abused: Not on file    Forced sexual  activity: Not on file  Other Topics Concern  . Not on file  Social History Narrative  . Not on file     Review of Systems: General: negative for chills, fever, night sweats or weight changes.  Cardiovascular: negative for chest pain, dyspnea on exertion, edema, orthopnea, palpitations, paroxysmal nocturnal dyspnea or shortness of breath Dermatological: negative for rash Respiratory: negative for cough or wheezing Urologic: negative for hematuria Abdominal: negative for nausea, vomiting, diarrhea, bright red blood per rectum, melena, or hematemesis Neurologic: negative for visual changes, syncope, or dizziness All other systems reviewed and are otherwise negative except as noted above.    Blood pressure (!) 184/72, pulse (!) 50, height 5\' 6"  (1.676 m), weight 109 lb (49.4 kg).  General appearance: alert and no distress Neck: no adenopathy, no carotid bruit, no JVD, supple, symmetrical, trachea midline and thyroid not enlarged, symmetric, no tenderness/mass/nodules Lungs: clear to auscultation bilaterally Heart: regular rate and rhythm, S1, S2 normal, no murmur, click, rub or gallop Extremities: extremities normal, atraumatic, no cyanosis or edema Pulses: 2+ and symmetric Skin: Skin color, texture, turgor normal. No rashes or lesions Neurologic: Alert and oriented X 3, normal strength and tone. Normal symmetric reflexes. Normal coordination and gait  EKG sinus bradycardia 50 with right bundle branch block and septal Q waves.  I personally reviewed this EKG.  ASSESSMENT AND PLAN:   Hypertension History of essential hypertension with blood pressure measured today 184/72.  He is on carvedilol and Entresto.  Paroxysmal atrial fibrillation (HCC) History of atrial fibrillation status post DC cardioversion by Dr. Haroldine Laws 09/27/2017 with marked clinical improvement after that.  He is maintaining sinus rhythm on amiodarone and Eliquis.  Severe left ventricular systolic  dysfunction History of severe LV dysfunction by 2D echo 07/01/2017 with an EF in the 30% range.  Her most recent echo performed 11/10/2017 revealed an EF of 60 to 65%.  This was done after her cardioversion.  She is completely asymptomatic and denies symptoms of heart failure.  Coronary artery disease History of noncritical CAD by cardiac catheterization which I performed 07/11/2017.  This revealed a 50% proximal LAD and a 60% mid to distal RCA stenosis.  These have been treated medically.  She denies chest pain.      Lorretta Harp MD FACP,FACC,FAHA, [facility] 05/05/2018 1:06 PM

## 2018-05-05 NOTE — Assessment & Plan Note (Signed)
History of essential hypertension with blood pressure measured today 184/72.  He is on carvedilol and Entresto.

## 2018-05-05 NOTE — Patient Instructions (Signed)
Medication Instructions:  NONE If you need a refill on your cardiac medications before your next appointment, please call your pharmacy.   Lab work: NONE If you have labs (blood work) drawn today and your tests are completely normal, you will receive your results only by: Marland Kitchen MyChart Message (if you have MyChart) OR . A paper copy in the mail If you have any lab test that is abnormal or we need to change your treatment, we will call you to review the results.  Testing/Procedures: NONE  Follow-Up: At [facility], you and your health needs are our priority.  As part of our continuing mission to provide you with exceptional heart care, we have created designated Provider Care Teams.  These Care Teams include your primary Cardiologist (physician) and Advanced Practice Providers (APPs -  Physician Assistants and Nurse Practitioners) who all work together to provide you with the care you need, when you need it. . You will need a follow up appointment in 6 months with an APP and 12 months with Dr. Gwenlyn Found.  Please call our office 2 months in advance to schedule this appointment.  You may see Dr. Gwenlyn Found or one of the following Advanced Practice Providers on your designated Care Team:   . Kerin Ransom, [place] . Almyra Deforest, PA-C . Fabian Sharp, PA-C . Jory Sims, DNP . Rosaria Ferries, PA-C . Roby Lofts, PA-C . Sande Rives, PA-C

## 2018-05-05 NOTE — Assessment & Plan Note (Signed)
History of severe LV dysfunction by 2D echo 07/01/2017 with an EF in the 30% range.  Her most recent echo performed 11/10/2017 revealed an EF of 60 to 65%.  This was done after her cardioversion.  She is completely asymptomatic and denies symptoms of heart failure.

## 2018-05-05 NOTE — Assessment & Plan Note (Signed)
History of atrial fibrillation status post DC cardioversion by Dr. Haroldine Laws 09/27/2017 with marked clinical improvement after that.  He is maintaining sinus rhythm on amiodarone and Eliquis.

## 2018-05-11 NOTE — Addendum Note (Signed)
Addended by: Zebedee Iba on: 05/11/2018 10:33 AM   Modules accepted: Orders

## 2018-07-14 ENCOUNTER — Other Ambulatory Visit (HOSPITAL_COMMUNITY): Payer: Self-pay | Admitting: Internal Medicine

## 2018-08-13 ENCOUNTER — Other Ambulatory Visit (HOSPITAL_COMMUNITY): Payer: Self-pay | Admitting: Internal Medicine

## 2018-10-16 ENCOUNTER — Other Ambulatory Visit (HOSPITAL_COMMUNITY): Payer: Self-pay | Admitting: Internal Medicine

## 2018-10-24 ENCOUNTER — Other Ambulatory Visit: Payer: Self-pay | Admitting: Cardiovascular Disease

## 2018-10-25 NOTE — Telephone Encounter (Signed)
14F SCR 1.54 (07/04/17 OUTDATED) 49.4KG LOVW/BERRY (05/05/18) WILL AUTHORIZE A 1MOS REFILL WITH A NOTE TO PHARMACY LABS NEEDED FOR FURTHER REFILLS

## 2018-11-06 IMAGING — NM NM MISC PROCEDURE
9 series · 54 of 54 positions shown · non-contrast
Comparison: none

[Series 1: wbr_r-proj_st wbr rest · 6.40mm/px · 6 of 64 frames shown]
[frame 6/64]
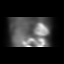
[frame 16/64]
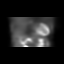
[frame 27/64]
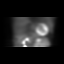
[frame 38/64]
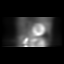
[frame 48/64]
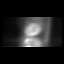
[frame 59/64]
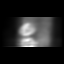

[Series 1: wbr rest · 6.40mm/px · 6 of 64 frames shown]
[frame 6/64]
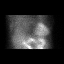
[frame 16/64]
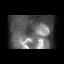
[frame 27/64]
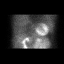
[frame 38/64]
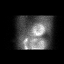
[frame 48/64]
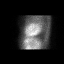
[frame 59/64]
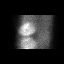

[Series 1: rest sax · 6.4mm · 6.40mm/px · 6 of 25 frames shown]
[frame 3/25]
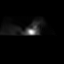
[frame 7/25]
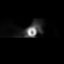
[frame 11/25]
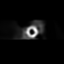
[frame 15/25]
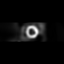
[frame 19/25]
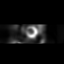
[frame 23/25]
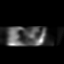

[Series 2: wbr_s-proj_st wbr stress-gsp · 6.40mm/px · 6 of 512 frames shown]
[frame 43/512]
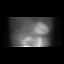
[frame 128/512]
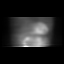
[frame 214/512]
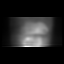
[frame 299/512]
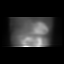
[frame 384/512]
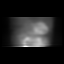
[frame 470/512]
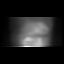

[Series 2: wbr stress-gsp · 6.40mm/px · 6 of 505 frames shown]
[frame 43/505]
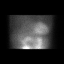
[frame 127/505]
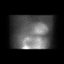
[frame 211/505]
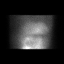
[frame 295/505]
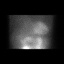
[frame 379/505]
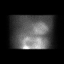
[frame 463/505]
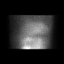

[Series 2: stress sax gs · 6.4mm · 6.40mm/px · 6 of 200 frames shown]
[frame 17/200]
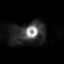
[frame 50/200]
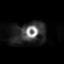
[frame 84/200]
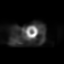
[frame 117/200]
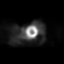
[frame 150/200]
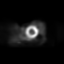
[frame 184/200]
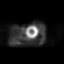

[Series 3: wbr stress-sum-em · 6.40mm/px · 6 of 64 frames shown]
[frame 6/64]
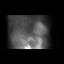
[frame 16/64]
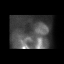
[frame 27/64]
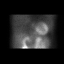
[frame 38/64]
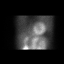
[frame 48/64]
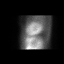
[frame 59/64]
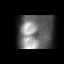

[Series 3: stress sax · 6.4mm · 6.40mm/px · 6 of 25 frames shown]
[frame 3/25]
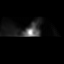
[frame 7/25]
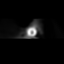
[frame 11/25]
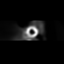
[frame 15/25]
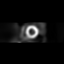
[frame 19/25]
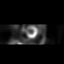
[frame 23/25]
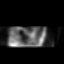

[Series 3: wbr_s-proj_st wbr stress-sum-em · 6.40mm/px · 6 of 64 frames shown]
[frame 6/64]
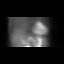
[frame 16/64]
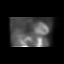
[frame 27/64]
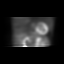
[frame 38/64]
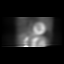
[frame 48/64]
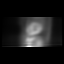
[frame 59/64]
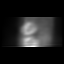

[54 of 54 positions shown; findings below may reference images not displayed]

Canned report from images found in remote index.

Refer to host system for actual result text.

## 2018-11-12 IMAGING — DX DG CHEST 2V
2 series · 2 of 2 positions shown · non-contrast
Comparison: None.

CLINICAL DATA: Cough, shortness of breath.

EXAM:
CHEST - 2 VIEW

[dg chest 2 view (1 of 2)]
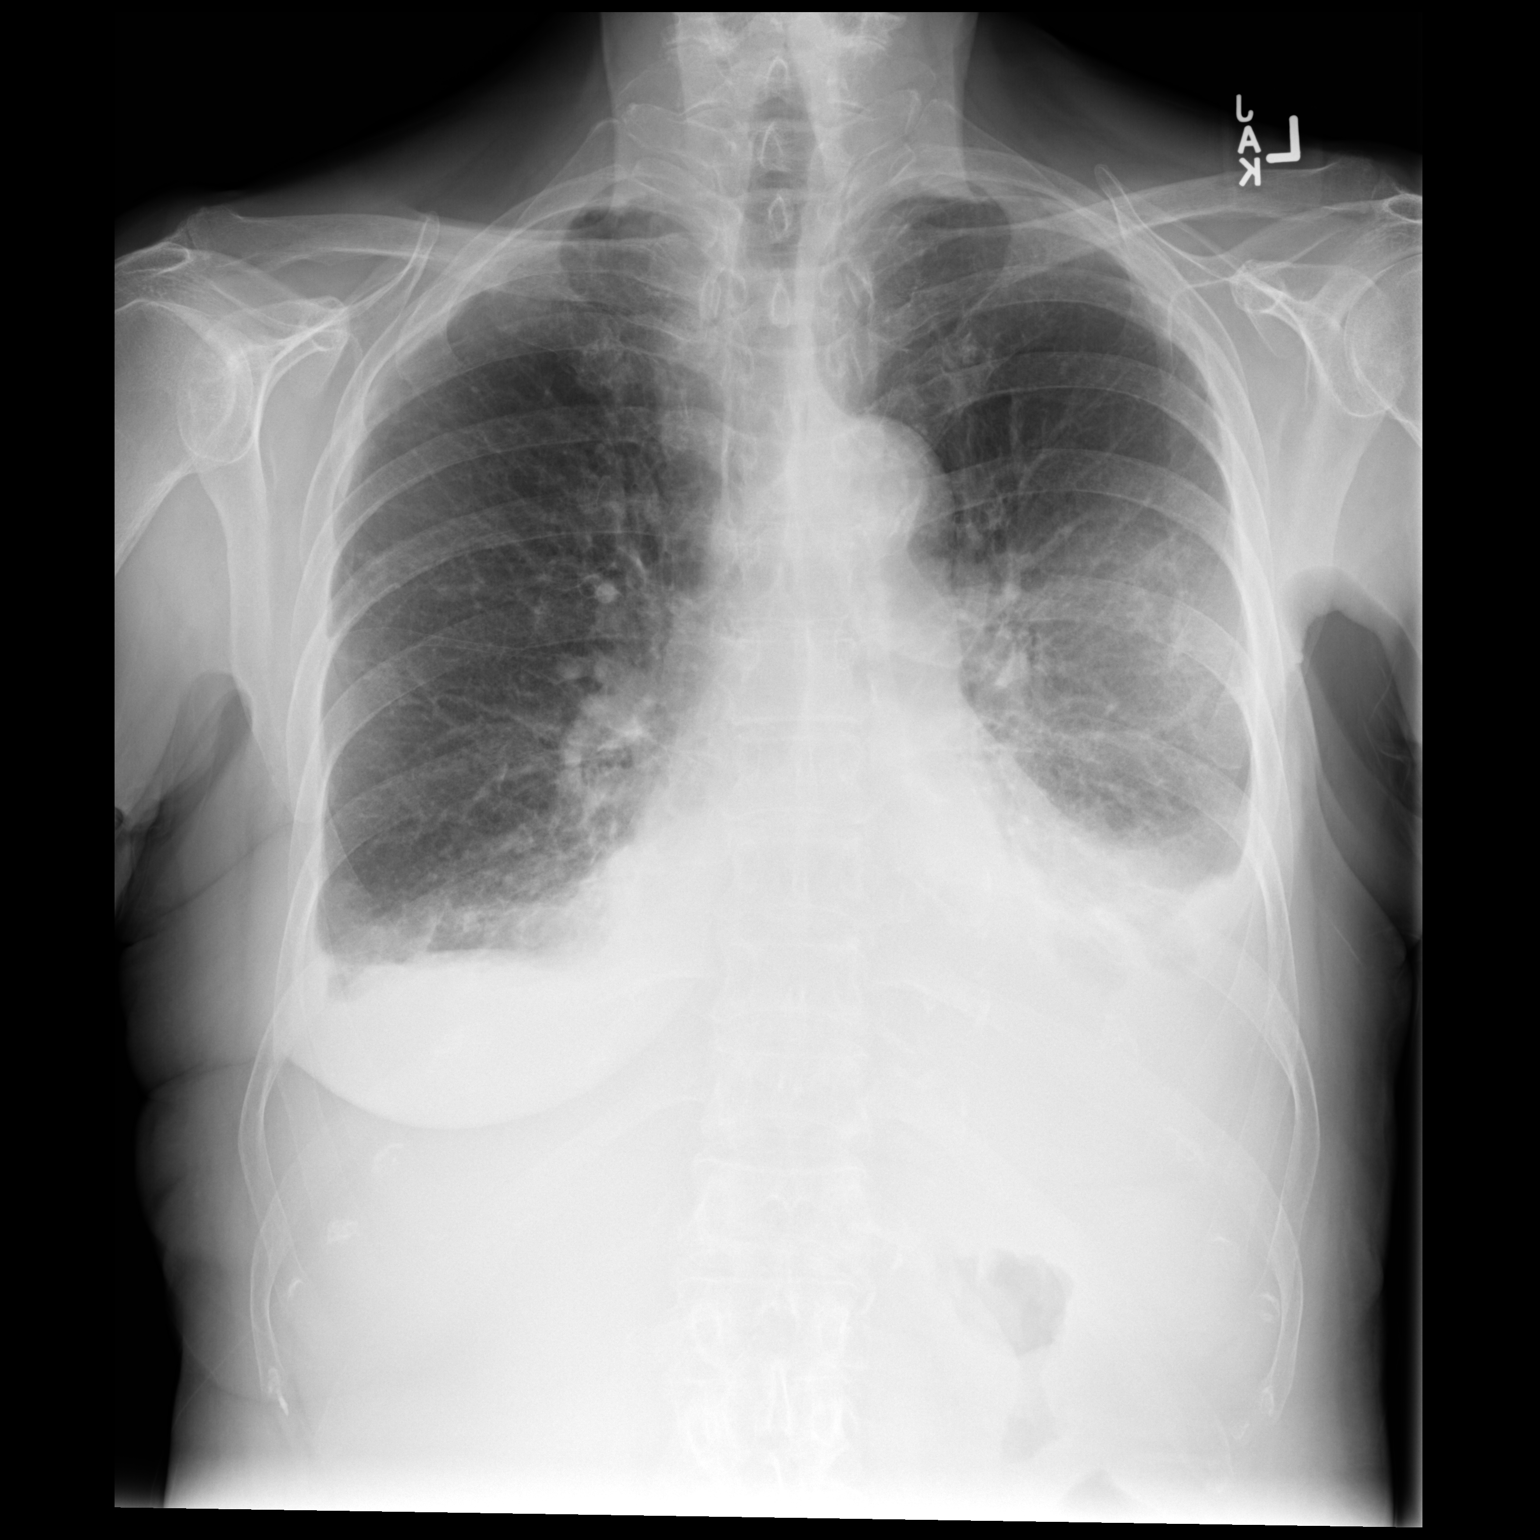

[dg chest 2 view (2 of 2)]
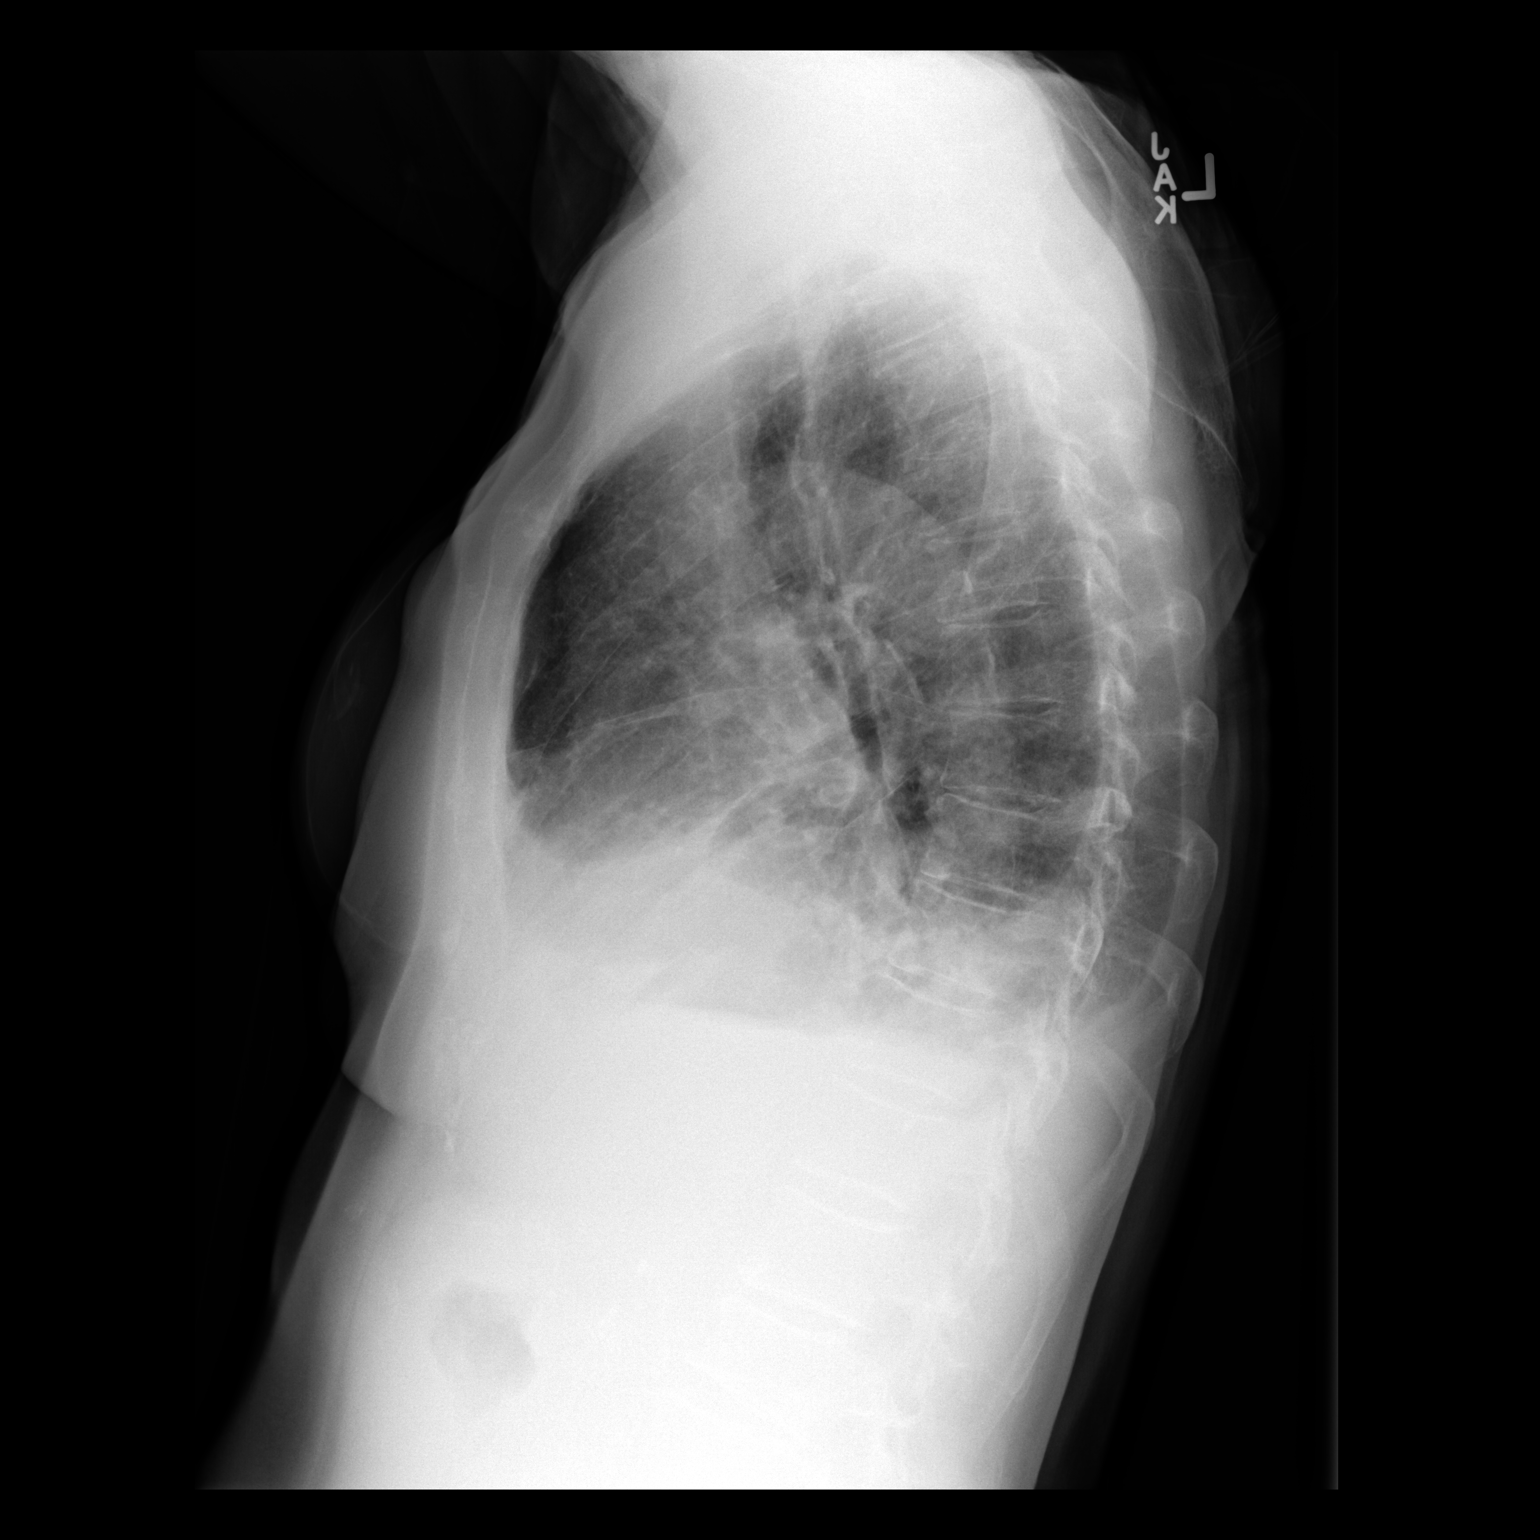

[2 of 2 positions shown; findings below may reference images not displayed]

FINDINGS: Mild cardiomegaly is noted. Central pulmonary vascular congestion is
noted with possible bibasilar edema. Mild bilateral pleural
effusions are noted. No pneumothorax is noted. Atherosclerosis of
thoracic aorta is noted. Bony thorax is unremarkable.
IMPRESSION: Cardiomegaly with central pulmonary vascular congestion and probable
bibasilar pulmonary edema with associated pleural effusions. This is
consistent with congestive heart failure.

## 2019-01-11 ENCOUNTER — Other Ambulatory Visit (HOSPITAL_COMMUNITY): Payer: Self-pay | Admitting: Internal Medicine

## 2019-01-18 ENCOUNTER — Telehealth: Payer: Self-pay | Admitting: Cardiovascular Disease

## 2019-01-18 NOTE — Telephone Encounter (Signed)
° ° °  Patient's daughter Monica Lyons calling to request referral for neuro consult. Stating her mother has been calling 911 inappropriately (she called to say her address book was missing) Also advised daughter to contact PCP   Please call

## 2019-01-18 NOTE — Telephone Encounter (Signed)
Will forward to Dr Gwenlyn Found to see if this ok to refer to neuro./cy

## 2019-01-19 NOTE — Telephone Encounter (Signed)
Spoke with Phil Dopp and informed her of the following recommendation:  "Neuro referral needs to come from her PCP, Roque Cash"  She verbalized understanding

## 2019-01-19 NOTE — Telephone Encounter (Signed)
Neuro referral needs to come from her PCP, Roque Cash

## 2019-02-03 ENCOUNTER — Other Ambulatory Visit (HOSPITAL_COMMUNITY): Payer: Self-pay | Admitting: Internal Medicine

## 2019-02-08 ENCOUNTER — Other Ambulatory Visit (HOSPITAL_COMMUNITY): Payer: Self-pay | Admitting: Internal Medicine

## 2019-02-08 NOTE — Telephone Encounter (Signed)
Pt overdue for 6 month f/u.  Please contact pt for future appointment. 

## 2019-02-10 ENCOUNTER — Other Ambulatory Visit: Payer: Self-pay | Admitting: Cardiovascular Disease

## 2019-02-14 ENCOUNTER — Telehealth (INDEPENDENT_AMBULATORY_CARE_PROVIDER_SITE_OTHER): Payer: Medicare Other | Admitting: Cardiology

## 2019-02-14 ENCOUNTER — Telehealth: Payer: Self-pay

## 2019-02-14 ENCOUNTER — Encounter: Payer: Self-pay | Admitting: Cardiology

## 2019-02-14 VITALS — Ht 67.0 in | Wt 109.0 lb

## 2019-02-14 DIAGNOSIS — I2510 Atherosclerotic heart disease of native coronary artery without angina pectoris: Secondary | ICD-10-CM

## 2019-02-14 DIAGNOSIS — I480 Paroxysmal atrial fibrillation: Secondary | ICD-10-CM | POA: Diagnosis not present

## 2019-02-14 DIAGNOSIS — I2721 Secondary pulmonary arterial hypertension: Secondary | ICD-10-CM

## 2019-02-14 NOTE — Telephone Encounter (Signed)

## 2019-02-14 NOTE — Progress Notes (Signed)
Virtual Visit via Telephone Note   This visit type was conducted due to national recommendations for restrictions regarding the COVID-19 Pandemic (e.g. social distancing) in an effort to limit this patient's exposure and mitigate transmission in our community.  Due to her co-morbid illnesses, this patient is at least at moderate risk for complications without adequate follow up.  This format is felt to be most appropriate for this patient at this time.  The patient did not have access to video technology/had technical difficulties with video requiring transitioning to audio format only (telephone).  All issues noted in this document were discussed and addressed.  No physical exam could be performed with this format.  Please refer to the patient's chart for her  consent to telehealth for [facility].   Date:  02/14/2019   ID:  Monica Lyons, DOB 21-Sep-1932, MRN [id number]  Patient Location: Home Provider Location: Home  PCP:  Secundino Ginger, PA-C  Cardiologist:  Dr Gwenlyn Found Electrophysiologist:  None   Evaluation Performed:  Follow-Up Visit  Chief Complaint:  none  History of Present Illness:    Monica Lyons is a 85 y.o. female with a history of a nonischemic cardiomyopathy.  This eventually improved with conversion of atrial fibrillation to normal sinus rhythm.  In June 2019 after her cardioversion she was maintaining normal sinus rhythm on Pacerone and carvedilol.  In reading the notes there apparently has been some confusion about the patient's medications.  Currently she is only taking Entresto and Eliquis.  She did tell me that she had what sounds like a photophobic reaction to carvedilol and stopped it.  She does not remember ever taking amiodarone under that name or its brand name Pacerone.  In any event she is done well since we saw her last.  She denies any tachycardia.  She denies any unusual shortness of breath, she says "my health is really quite well right  now".  The patient does not have symptoms concerning for COVID-19 infection (fever, chills, cough, or new shortness of breath).    Past Medical History:  Diagnosis Date  . Asthma   . Breast cancer ([place])   . Cancer ([place])   . Hypertension   . Osteopenia 04/2012   T score -2.0 FRAX 31%/19%  . Personal history of chemotherapy    Past Surgical History:  Procedure Laterality Date  . APPENDECTOMY    . AUGMENTATION MAMMAPLASTY Left   . BREAST SURGERY     Mastectomy  . CARDIOVERSION N/A 09/27/2017   Procedure: CARDIOVERSION;  Surgeon: Jolaine Artist, MD;  Location: [facility];  Service: Cardiovascular;  Laterality: N/A;  . LEFT HEART CATH AND CORONARY ANGIOGRAPHY N/A 07/11/2017   Procedure: LEFT HEART CATH AND CORONARY ANGIOGRAPHY;  Surgeon: Lorretta Harp, MD;  Location: [place] CV [facility];  Service: Cardiovascular;  Laterality: N/A;  . MASTECTOMY Left 1996  . NASAL SEPTUM SURGERY       Current Meds  Medication Sig  . albuterol (PROVENTIL HFA;VENTOLIN HFA) 108 (90 BASE) MCG/ACT inhaler Inhale 2 puffs into the lungs every 6 (six) hours as needed for wheezing or shortness of breath.   . Cholecalciferol (VITAMIN D3) 2000 units TABS Take 2,000 Units by mouth daily.   Marland Kitchen ELIQUIS 2.5 MG TABS tablet TAKE ONE TABLET BY MOUTH TWICE A DAY  . EPINEPHrine 0.3 mg/0.3 mL IJ SOAJ injection Inject 0.3 mg into the muscle once.   . fexofenadine (ALLEGRA) 180 MG tablet Take 180 mg by mouth daily as  needed for allergies.   . fluticasone (FLOVENT HFA) 110 MCG/ACT inhaler Inhale 1 puff into the lungs 2 (two) times daily.  . sacubitril-valsartan (ENTRESTO) 49-51 MG Take 1 tablet by mouth 2 (two) times daily. PT OVERDUE FOR OV PLEASE CALL FOR APPT  . vitamin B-12 (CYANOCOBALAMIN) 500 MCG tablet Take 500 mcg by mouth 2 (two) times daily.     Allergies:   Aspirin, Bee venom, Erythromycin, Other, Nutmeg oil (myristica oil), Zoster vaccine live, Amlodipine besylate, Chlorthalidone, Diltiazem hcl, Morphine  and related, Sulfa antibiotics, Symbicort [budesonide-formoterol fumarate], Talc, and Tape   Social History   Tobacco Use  . Smoking status: Never Smoker  . Smokeless tobacco: Never Used  Substance Use Topics  . Alcohol use: No  . Drug use: No     Family Hx: The patient's family history includes Asthma in her maternal grandfather; Cancer in her mother; Diabetes in her maternal aunt, maternal grandfather, and mother; Hyperlipidemia in her father; Hypertension in her mother; Rheum arthritis in her mother.  ROS:   Please see the history of present illness.    All other systems reviewed and are negative.   Prior CV studies:   The following studies were reviewed today: Echo August 2019- EF 60-65%, PA 37 mmHg, severe LAE.  Labs/Other Tests and Data Reviewed:    EKG:  No ECG reviewed.  Recent Labs: No results found for requested labs within last 8760 hours.   Recent Lipid Panel No results found for: CHOL, TRIG, HDL, CHOLHDL, LDLCALC, LDLDIRECT  Wt Readings from Last 3 Encounters:  02/14/19 109 lb (49.4 kg)  05/05/18 109 lb (49.4 kg)  11/01/17 110 lb (49.9 kg)     Objective:    Vital Signs:  Ht 5\' 7"  (1.702 m)   Wt 109 lb (49.4 kg)   BMI 17.07 kg/m    VITAL SIGNS:  reviewed  ASSESSMENT & PLAN:    NICM EF in March 2019 reportedly was 30%, this improved to 60 to 65% after her cardioversion in June 2019.  CAD 50% proximal LAD, 60% mid RCA at cath April 2019  Chronic anticoagulation On Eliquis 2.5 twice daily   COVID-19 Education: The signs and symptoms of COVID-19 were discussed with the patient and how to seek care for testing (follow up with PCP or arrange E-visit).  The importance of social distancing was discussed today.  Time:   Today, I have spent 20 minutes with the patient with telehealth technology discussing the above problems.     Medication Adjustments/Labs and Tests Ordered: Current medicines are reviewed at length with the patient today.   Concerns regarding medicines are outlined above.   Tests Ordered: No orders of the defined types were placed in this encounter.   Medication Changes: No orders of the defined types were placed in this encounter.   Follow Up:   Dr Gwenlyn Found in 3 months.  The patient seems a little confused about her medications, she knows what she is taking now but does not ever remember taking amiodarone.  She described a reaction that sounded like photosensitivity, that would be more likely from amiodarone than carvedilol.  I think it would be best if we saw her in the office and did an EKG in a few months.  She is at risk for recurrent atrial fibrillation with a severely dilated left atrium.  Angelena Form, PA-C  02/14/2019 3:18 PM    [facility]

## 2019-02-14 NOTE — Telephone Encounter (Signed)
Contacted patient to discuss AVS Instructions. Gave patient Luke's recommendations from today's virtual office visit. Follow up appt scheduled with Dr Gwenlyn Found in 3 months. Patient voiced understanding and AVS mailed.    Patient then decided that she wants to go to a office closer to her. She stated she lives in [place] and [place] is to far to drive. I advised patient that we would reach out to the dr and once we receive the ok we will switch her she voiced understanding.

## 2019-02-14 NOTE — Patient Instructions (Signed)
Medication Instructions:  Your physician recommends that you continue on your current medications as directed. Please refer to the Current Medication list given to you today.  *If you need a refill on your cardiac medications before your next appointment, please call your pharmacy*  Lab Work: None  If you have labs (blood work) drawn today and your tests are completely normal, you will receive your results only by: Marland Kitchen MyChart Message (if you have MyChart) OR . A paper copy in the mail If you have any lab test that is abnormal or we need to change your treatment, we will call you to review the results.  Testing/Procedures: None  Follow-Up: At [facility], you and your health needs are our priority.  As part of our continuing mission to provide you with exceptional heart care, we have created designated Provider Care Teams.  These Care Teams include your primary Cardiologist (physician) and Advanced Practice Providers (APPs -  Physician Assistants and Nurse Practitioners) who all work together to provide you with the care you need, when you need it.  Your next appointment:   3 months  The format for your next appointment:   In Person  Provider:   Quay Burow, MD  Other Instructions

## 2019-02-15 ENCOUNTER — Telehealth: Payer: Self-pay | Admitting: Cardiovascular Disease

## 2019-02-15 NOTE — Telephone Encounter (Signed)
New Message   Patient currently lives in [facility] and would like to switch from Dr. Gwenlyn Found to Dr. Harriet Masson. Please advise.

## 2019-02-15 NOTE — Telephone Encounter (Signed)
That's fine with me.  JJB 

## 2019-02-21 ENCOUNTER — Telehealth: Payer: Self-pay | Admitting: Cardiovascular Disease

## 2019-02-21 NOTE — Telephone Encounter (Signed)
This patient is confused about her medications- both my CMA and myself went over her medications and she seemed unsure.   Kerin Ransom PA-C 02/21/2019 4:18 PM

## 2019-02-21 NOTE — Telephone Encounter (Signed)
Pt stated that she was instructed to stop taking Entresto at her last visit and once she got the AVS in the mail she noticed the error. I corrected her medication list-stating she is not taking Entresto at this time. Will route to [facility] to clarify and make sure this is correct.

## 2019-02-21 NOTE — Telephone Encounter (Signed)
Patient reports that the AVS she received from her telemedicine visit 02/14/19 is incorrect.   She does not take Entresto any longer. She said she was looking at the AVS and it is still on her medication list. She does not want to risk being prescribed that medication. It is expensive and she does not want to pay for it  Please contact her to make sure it gets fixed

## 2019-02-27 ENCOUNTER — Telehealth: Payer: Self-pay

## 2019-02-27 NOTE — Telephone Encounter (Signed)
-----   Message from Rivka Barbara sent at 02/26/2019  4:08 PM EST ----- Regarding: RE: changing providers I have scheduled the patient with Dr. Harriet Masson on 05/31/19. I also mailed her reminder.  ----- Message ----- From: Harold Hedge, CMA Sent: 02/26/2019   3:11 PM EST To: Rivka Barbara Subject: RE: changing providers                         Ok thanks! ----- Message ----- From: Rivka Barbara Sent: 02/26/2019  10:36 AM EST To: Harold Hedge, CMA Subject: RE: changing providers                         No ma'am. I heard from Dr. Gwenlyn Found but not Dr. Harriet Masson. I will message Dr. Harriet Masson again.  ----- Message ----- From: Harold Hedge, CMA Sent: 02/26/2019   9:22 AM EST To: Rivka Barbara Subject: RE: changing providers                         Any update for this patient also please advise patient to [place] TO HER NEXT APPT.  Thanks,  Darden Dates ----- Message ----- From: Rivka Barbara Sent: 02/15/2019   8:12 AM EST To: Harold Hedge, CMA Subject: RE: changing providers                         Good morning,  I did send a note to Dr. Gwenlyn Found and Dr. Harriet Masson to get the okay for the switch. Once I hear back I will contact the patient. I just wanted to let you know. Have a great day! ----- Message ----- From: Harold Hedge, CMA Sent: 02/14/2019   3:46 PM EST To: Cv Div Nl Scheduling Subject: changing providers                             This patient is elderly and wants to switch to the [facility] office because [place] is too far and she gets confused and does not want to get lost. Her primary cardiologist is Dr Gwenlyn Found and I'm not sure of the providers in the [facility] office. I scheduled her a follow up appt with Dr Gwenlyn Found in February, this appt can be canceled if she is switched to the [facility] office. This patient gets very confused so please speak slow and clear when communicating with her.  Thanks,  [employer]

## 2019-03-06 NOTE — Telephone Encounter (Signed)
Per Lurena Joiner pateint is supposed to be taking Entresto bid.   Tried to contact patient but unable to leave a message. Will try to call her again later to discuss her meds.

## 2019-05-22 ENCOUNTER — Ambulatory Visit: Payer: Medicare Other | Admitting: Cardiovascular Disease

## 2019-05-31 ENCOUNTER — Ambulatory Visit: Payer: Medicare Other | Admitting: Cardiology

## 2019-06-19 ENCOUNTER — Telehealth: Payer: Self-pay | Admitting: Cardiovascular Disease

## 2019-06-19 NOTE — Telephone Encounter (Signed)
Spoke with pt and pt notes heart pounding and SOB  Per pt notes this when climbing stairs and it becomes very difficult to catch breath Pt has been using inhaler with immediate relief but this is short lived the patient is unable to check vital signs does not know how to use the machine Offer pt appt today with Dr Gwenlyn Found at 2:15 pm pt declined stated was to tired to get here Appt made for 07/13/19 at 9:00 am Will forward message to Dr Gwenlyn Found for review .Adonis Housekeeper

## 2019-06-19 NOTE — Telephone Encounter (Signed)
Patient states that she feels one side of her heart is pounding really hard, and the other side is not.  She states she is SOB, she is feeling dizzy. She states this has been going on for about a month, but has been getting progressively worse over the weeks.

## 2019-07-01 ENCOUNTER — Emergency Department (HOSPITAL_COMMUNITY): Payer: Medicare PPO

## 2019-07-01 ENCOUNTER — Other Ambulatory Visit: Payer: Self-pay

## 2019-07-01 ENCOUNTER — Encounter (HOSPITAL_COMMUNITY): Payer: Self-pay | Admitting: Internal Medicine

## 2019-07-01 ENCOUNTER — Inpatient Hospital Stay (HOSPITAL_COMMUNITY)
Admission: EM | Admit: 2019-07-01 | Discharge: 2019-07-06 | DRG: 308 | Disposition: A | Discharge status: Skilled Nursing Facility | Payer: Medicare PPO | Attending: Family Medicine | Admitting: Family Medicine

## 2019-07-01 DIAGNOSIS — I255 Ischemic cardiomyopathy: Secondary | ICD-10-CM | POA: Diagnosis present

## 2019-07-01 DIAGNOSIS — N1832 Chronic kidney disease, stage 3b: Secondary | ICD-10-CM | POA: Diagnosis present

## 2019-07-01 DIAGNOSIS — R079 Chest pain, unspecified: Secondary | ICD-10-CM | POA: Diagnosis present

## 2019-07-01 DIAGNOSIS — J90 Pleural effusion, not elsewhere classified: Secondary | ICD-10-CM | POA: Diagnosis present

## 2019-07-01 DIAGNOSIS — I480 Paroxysmal atrial fibrillation: Principal | ICD-10-CM | POA: Diagnosis present

## 2019-07-01 DIAGNOSIS — Z825 Family history of asthma and other chronic lower respiratory diseases: Secondary | ICD-10-CM

## 2019-07-01 DIAGNOSIS — Z20822 Contact with and (suspected) exposure to covid-19: Secondary | ICD-10-CM | POA: Diagnosis present

## 2019-07-01 DIAGNOSIS — Z887 Allergy status to serum and vaccine status: Secondary | ICD-10-CM

## 2019-07-01 DIAGNOSIS — Z9183 Wandering in diseases classified elsewhere: Secondary | ICD-10-CM

## 2019-07-01 DIAGNOSIS — I130 Hypertensive heart and chronic kidney disease with heart failure and stage 1 through stage 4 chronic kidney disease, or unspecified chronic kidney disease: Secondary | ICD-10-CM | POA: Diagnosis present

## 2019-07-01 DIAGNOSIS — Z91018 Allergy to other foods: Secondary | ICD-10-CM

## 2019-07-01 DIAGNOSIS — I25119 Atherosclerotic heart disease of native coronary artery with unspecified angina pectoris: Secondary | ICD-10-CM | POA: Diagnosis present

## 2019-07-01 DIAGNOSIS — I2721 Secondary pulmonary arterial hypertension: Secondary | ICD-10-CM | POA: Diagnosis present

## 2019-07-01 DIAGNOSIS — I2510 Atherosclerotic heart disease of native coronary artery without angina pectoris: Secondary | ICD-10-CM | POA: Diagnosis present

## 2019-07-01 DIAGNOSIS — Z886 Allergy status to analgesic agent status: Secondary | ICD-10-CM

## 2019-07-01 DIAGNOSIS — Z882 Allergy status to sulfonamides status: Secondary | ICD-10-CM

## 2019-07-01 DIAGNOSIS — Z83438 Family history of other disorder of lipoprotein metabolism and other lipidemia: Secondary | ICD-10-CM

## 2019-07-01 DIAGNOSIS — I4891 Unspecified atrial fibrillation: Secondary | ICD-10-CM | POA: Diagnosis not present

## 2019-07-01 DIAGNOSIS — R9389 Abnormal findings on diagnostic imaging of other specified body structures: Secondary | ICD-10-CM | POA: Diagnosis present

## 2019-07-01 DIAGNOSIS — Z8261 Family history of arthritis: Secondary | ICD-10-CM

## 2019-07-01 DIAGNOSIS — Z9114 Patient's other noncompliance with medication regimen: Secondary | ICD-10-CM

## 2019-07-01 DIAGNOSIS — N182 Chronic kidney disease, stage 2 (mild): Secondary | ICD-10-CM | POA: Diagnosis present

## 2019-07-01 DIAGNOSIS — I428 Other cardiomyopathies: Secondary | ICD-10-CM | POA: Diagnosis present

## 2019-07-01 DIAGNOSIS — M8580 Other specified disorders of bone density and structure, unspecified site: Secondary | ICD-10-CM | POA: Diagnosis present

## 2019-07-01 DIAGNOSIS — Z9109 Other allergy status, other than to drugs and biological substances: Secondary | ICD-10-CM

## 2019-07-01 DIAGNOSIS — Z91030 Bee allergy status: Secondary | ICD-10-CM

## 2019-07-01 DIAGNOSIS — Z79899 Other long term (current) drug therapy: Secondary | ICD-10-CM

## 2019-07-01 DIAGNOSIS — I10 Essential (primary) hypertension: Secondary | ICD-10-CM | POA: Diagnosis present

## 2019-07-01 DIAGNOSIS — I5023 Acute on chronic systolic (congestive) heart failure: Secondary | ICD-10-CM | POA: Diagnosis present

## 2019-07-01 DIAGNOSIS — H53149 Visual discomfort, unspecified: Secondary | ICD-10-CM | POA: Diagnosis present

## 2019-07-01 DIAGNOSIS — Z809 Family history of malignant neoplasm, unspecified: Secondary | ICD-10-CM

## 2019-07-01 DIAGNOSIS — F0390 Unspecified dementia without behavioral disturbance: Secondary | ICD-10-CM | POA: Diagnosis present

## 2019-07-01 DIAGNOSIS — Z8249 Family history of ischemic heart disease and other diseases of the circulatory system: Secondary | ICD-10-CM

## 2019-07-01 DIAGNOSIS — Z91048 Other nonmedicinal substance allergy status: Secondary | ICD-10-CM

## 2019-07-01 DIAGNOSIS — Z9221 Personal history of antineoplastic chemotherapy: Secondary | ICD-10-CM

## 2019-07-01 DIAGNOSIS — Z885 Allergy status to narcotic agent status: Secondary | ICD-10-CM

## 2019-07-01 DIAGNOSIS — J45909 Unspecified asthma, uncomplicated: Secondary | ICD-10-CM | POA: Diagnosis present

## 2019-07-01 DIAGNOSIS — Z833 Family history of diabetes mellitus: Secondary | ICD-10-CM

## 2019-07-01 DIAGNOSIS — Z853 Personal history of malignant neoplasm of breast: Secondary | ICD-10-CM

## 2019-07-01 HISTORY — DX: Unspecified atrial fibrillation: I48.91

## 2019-07-01 HISTORY — DX: Unspecified dementia, unspecified severity, without behavioral disturbance, psychotic disturbance, mood disturbance, and anxiety: F03.90

## 2019-07-01 HISTORY — DX: Chronic kidney disease, stage 2 (mild): N18.2

## 2019-07-01 HISTORY — DX: Other cardiomyopathies: I42.8

## 2019-07-01 LAB — BASIC METABOLIC PANEL
Anion gap: 11 (ref 5–15)
BUN: 28 mg/dL — ABNORMAL HIGH (ref 8–23)
CO2: 22 mmol/L (ref 22–32)
Calcium: 9.3 mg/dL (ref 8.9–10.3)
Chloride: 105 mmol/L (ref 98–111)
Creatinine, Ser: 1.4 mg/dL — ABNORMAL HIGH (ref 0.44–1.00)
GFR calc Af Amer: 40 mL/min — ABNORMAL LOW (ref >60)
GFR calc non Af Amer: 34 mL/min — ABNORMAL LOW (ref >60)
Glucose, Bld: 112 mg/dL — ABNORMAL HIGH (ref 70–99)
Potassium: 3.8 mmol/L (ref 3.5–5.1)
Sodium: 138 mmol/L (ref 135–145)

## 2019-07-01 LAB — CBC WITH DIFFERENTIAL/PLATELET
Abs Immature Granulocytes: 0.03 10*3/uL (ref 0.00–0.07)
Basophils Absolute: 0.1 10*3/uL (ref 0.0–0.1)
Basophils Relative: 1 %
Eosinophils Absolute: 0 10*3/uL (ref 0.0–0.5)
Eosinophils Relative: 1 %
HCT: 41.2 % (ref 36.0–46.0)
Hemoglobin: 13.4 g/dL (ref 12.0–15.0)
Immature Granulocytes: 0 %
Lymphocytes Relative: 17 %
Lymphs Abs: 1.2 10*3/uL (ref 0.7–4.0)
MCH: 29.1 pg (ref 26.0–34.0)
MCHC: 32.5 g/dL (ref 30.0–36.0)
MCV: 89.6 fL (ref 80.0–100.0)
Monocytes Absolute: 0.7 10*3/uL (ref 0.1–1.0)
Monocytes Relative: 9 %
Neutro Abs: 5.5 10*3/uL (ref 1.7–7.7)
Neutrophils Relative %: 72 %
Platelets: 209 10*3/uL (ref 150–400)
RBC: 4.6 MIL/uL (ref 3.87–5.11)
RDW: 14.1 % (ref 11.5–15.5)
WBC: 7.5 10*3/uL (ref 4.0–10.5)
nRBC: 0 % (ref 0.0–0.2)

## 2019-07-01 LAB — CBG MONITORING, ED: Glucose-Capillary: 107 mg/dL — ABNORMAL HIGH (ref 70–99)

## 2019-07-01 LAB — TROPONIN I (HIGH SENSITIVITY)
Troponin I (High Sensitivity): 28 ng/L — ABNORMAL HIGH (ref <18)
Troponin I (High Sensitivity): 23 ng/L — ABNORMAL HIGH (ref <18)

## 2019-07-01 LAB — MAGNESIUM: Magnesium: 1.9 mg/dL (ref 1.7–2.4)

## 2019-07-01 MED ORDER — FLUTICASONE PROPIONATE HFA 110 MCG/ACT IN AERO: 1.0000 | INHALATION_SPRAY | Freq: Two times a day (BID) | RESPIRATORY_TRACT | Status: DC

## 2019-07-01 MED ORDER — APIXABAN 2.5 MG PO TABS
2.5000 mg | ORAL_TABLET | Freq: Once | ORAL | Status: DC
  Filled 2019-07-01 (×2): qty 1

## 2019-07-01 MED ORDER — HALOPERIDOL LACTATE 5 MG/ML IJ SOLN
2.5000 mg | Freq: Once | INTRAMUSCULAR | Status: AC
  Administered 2019-07-01: 2.5 mg via INTRAVENOUS
  Filled 2019-07-01: qty 1

## 2019-07-01 MED ORDER — ACETAMINOPHEN 650 MG RE SUPP: 650.0000 mg | Freq: Four times a day (QID) | RECTAL | Status: DC | PRN

## 2019-07-01 MED ORDER — METOPROLOL TARTRATE 5 MG/5ML IV SOLN
5.0000 mg | Freq: Once | INTRAVENOUS | Status: AC
  Administered 2019-07-01: 5 mg via INTRAVENOUS
  Filled 2019-07-01: qty 5

## 2019-07-01 MED ORDER — ONDANSETRON HCL 4 MG PO TABS: 4.0000 mg | ORAL_TABLET | Freq: Four times a day (QID) | ORAL | Status: DC | PRN

## 2019-07-01 MED ORDER — CYANOCOBALAMIN 500 MCG PO TABS
500.0000 ug | ORAL_TABLET | Freq: Two times a day (BID) | ORAL | Status: DC
  Administered 2019-07-03 – 2019-07-06 (×7): 500 ug via ORAL
  Filled 2019-07-01 (×11): qty 1

## 2019-07-01 MED ORDER — ACETAMINOPHEN 325 MG PO TABS
650.0000 mg | ORAL_TABLET | Freq: Four times a day (QID) | ORAL | Status: DC | PRN
  Administered 2019-07-02 – 2019-07-05 (×3): 650 mg via ORAL
  Filled 2019-07-01 (×3): qty 2

## 2019-07-01 MED ORDER — LORAZEPAM 2 MG/ML IJ SOLN
0.5000 mg | Freq: Once | INTRAMUSCULAR | Status: AC
  Administered 2019-07-01: 0.5 mg via INTRAVENOUS
  Filled 2019-07-01: qty 1

## 2019-07-01 MED ORDER — ONDANSETRON HCL 4 MG/2ML IJ SOLN
4.0000 mg | Freq: Four times a day (QID) | INTRAMUSCULAR | Status: DC | PRN
  Administered 2019-07-04: 4 mg via INTRAVENOUS
  Filled 2019-07-01: qty 2

## 2019-07-01 MED ORDER — METOPROLOL TARTRATE 25 MG PO TABS
25.0000 mg | ORAL_TABLET | Freq: Two times a day (BID) | ORAL | Status: DC
  Administered 2019-07-02 – 2019-07-06 (×9): 25 mg via ORAL
  Filled 2019-07-01 (×11): qty 1

## 2019-07-01 MED ORDER — APIXABAN 2.5 MG PO TABS
2.5000 mg | ORAL_TABLET | Freq: Two times a day (BID) | ORAL | Status: DC
  Administered 2019-07-02 – 2019-07-06 (×8): 2.5 mg via ORAL
  Filled 2019-07-01 (×11): qty 1

## 2019-07-01 MED ORDER — LOSARTAN POTASSIUM 50 MG PO TABS
50.0000 mg | ORAL_TABLET | Freq: Every day | ORAL | Status: DC
  Administered 2019-07-02 – 2019-07-03 (×2): 50 mg via ORAL
  Filled 2019-07-01 (×2): qty 1

## 2019-07-01 NOTE — ED Provider Notes (Signed)
[facility] EMERGENCY DEPARTMENT Provider Note   CSN: [id number] Arrival date & time: 07/01/19  1549     History No chief complaint on file.   Monica Lyons is a 86 y.o. female.  HPI   86 year old female with chest pain.  She is not a good historian. She is repetitive at times and somewhat paranoid.  Patient lives at home alone with her dog. EMS called by concerned neighbor.  Patient was complaining  of chest pain.  She cannot tell me exactly when this began though.  States that she was at home and her chest felt tight and she had to take off her bra because of this.  This did not improve her symptoms though.  Currently she does not have any pain.  She cannot tell me when exactly it stopped.  Denies any dyspnea.  EMS reports that the house was generally neat although objects were placed in odd places.  They found multiple sticky notes with "?" on them.  Patient's pill organizer was in [place].  Some days seem skipped and contents of each compartment weren't consistent.   I spoke with her son Monica Lyons via telephone.  He is currently driving to [facility] to see her.  He reports increasing confusion over the past several months.  She has been intermittently complaining of chest pain and shortness of breath.  They are in the process of trying to get her to see gerontology through [facility].  There are some notes in her chart from where she called cardiology about ongoing chest discomfort and respiratory symptoms.  Scheduled for an upcoming appointment on 07/13/2019.   Past Medical History:  Diagnosis Date  . Asthma   . Breast cancer ([place])   . Cancer ([place])   . Hypertension   . Osteopenia 04/2012   T score -2.0 FRAX 31%/19%  . Personal history of chemotherapy     Patient Active Problem List   Diagnosis Date Noted  . Coronary artery disease 08/02/2017  . Severe left ventricular systolic dysfunction [id number]  . Paroxysmal atrial fibrillation ([place]) 06/21/2017    . Acute combined systolic and diastolic heart failure ([place]) 06/21/2017  . Moderate pulmonary arterial systolic hypertension ([place]) 06/21/2017  . Cancer ([place])   . Asthma   . Osteopenia   . Hypertension    Past Surgical History:  Procedure Laterality Date  . APPENDECTOMY    . AUGMENTATION MAMMAPLASTY Left   . BREAST SURGERY     Mastectomy  . CARDIOVERSION N/A 09/27/2017   Procedure: CARDIOVERSION;  Surgeon: Jolaine Artist, MD;  Location: [facility];  Service: Cardiovascular;  Laterality: N/A;  . LEFT HEART CATH AND CORONARY ANGIOGRAPHY N/A 07/11/2017   Procedure: LEFT HEART CATH AND CORONARY ANGIOGRAPHY;  Surgeon: Lorretta Harp, MD;  Location: [facility] CV LAB;  Service: Cardiovascular;  Laterality: N/A;  . MASTECTOMY Left 1996  . NASAL SEPTUM SURGERY      OB History    Gravida  2   Para  2   Term  2   Preterm      AB      Living  2     SAB      TAB      Ectopic      Multiple      Live Births             Family History  Problem Relation Age of Onset  . Rheum arthritis Mother   . Cancer Mother  bladder  . Diabetes Mother   . Hypertension Mother   . Hyperlipidemia Father   . Diabetes Maternal Aunt   . Asthma Maternal Grandfather   . Diabetes Maternal Grandfather     Social History   Tobacco Use  . Smoking status: Never Smoker  . Smokeless tobacco: Never Used  Substance Use Topics  . Alcohol use: No  . Drug use: No    Home Medications Prior to Admission medications   Medication Sig Start Date End Date Taking? Authorizing Provider  albuterol (PROVENTIL HFA;VENTOLIN HFA) 108 (90 BASE) MCG/ACT inhaler Inhale 2 puffs into the lungs every 6 (six) hours as needed for wheezing or shortness of breath.     [provider]  Cholecalciferol (VITAMIN D3) 2000 units TABS Take 2,000 Units by mouth daily.     [provider]  ELIQUIS 2.5 MG TABS tablet TAKE ONE TABLET BY MOUTH TWICE A DAY 02/12/19   Lorretta Harp, MD   EPINEPHrine 0.3 mg/0.3 mL IJ SOAJ injection Inject 0.3 mg into the muscle once.     [provider]  fexofenadine (ALLEGRA) 180 MG tablet Take 180 mg by mouth daily as needed for allergies.     [provider]  fluticasone (FLOVENT HFA) 110 MCG/ACT inhaler Inhale 1 puff into the lungs 2 (two) times daily.    [provider]  sacubitril-valsartan (ENTRESTO) 49-51 MG Take 1 tablet by mouth 2 (two) times daily. PT OVERDUE FOR OV PLEASE CALL FOR APPT Patient not taking: Reported on 02/21/2019 02/08/19   Bensimhon, Shaune Pascal, MD  vitamin B-12 (CYANOCOBALAMIN) 500 MCG tablet Take 500 mcg by mouth 2 (two) times daily.    [provider]    Allergies    Aspirin, Bee venom, Erythromycin, Other, Nutmeg oil (myristica oil), Zoster vaccine live, Amlodipine besylate, Chlorthalidone, Diltiazem hcl, Morphine and related, Sulfa antibiotics, Symbicort [budesonide-formoterol fumarate], Talc, and Tape  Review of Systems   Review of Systems Level 5 caveat because of confusion.  Physical Exam Updated Vital Signs BP (!) 150/118   Pulse (!) 101   Temp 97.9 F (36.6 C) (Oral)   Resp (!) 23   Ht 5\' 7"  (1.702 m)   Wt 49.4 kg   SpO2 96%   BMI 17.06 kg/m   Physical Exam Vitals and nursing note reviewed.  Constitutional:      General: She is not in acute distress.    Appearance: She is well-developed.  HENT:     Head: Normocephalic and atraumatic.  Eyes:     General:        Right eye: No discharge.        Left eye: No discharge.     Conjunctiva/sclera: Conjunctivae normal.  Cardiovascular:     Rate and Rhythm: Tachycardia present. Rhythm irregular.     Heart sounds: Normal heart sounds. No murmur. No friction rub. No gallop.      Comments: Mild tachycardia. irreg irreg.  Pulmonary:     Effort: Pulmonary effort is normal. No respiratory distress.     Breath sounds: Normal breath sounds.  Abdominal:     General: There is no distension.     Palpations: Abdomen is  soft.     Tenderness: There is no abdominal tenderness.  Musculoskeletal:        General: No tenderness.     Cervical back: Neck supple.  Skin:    General: Skin is warm and dry.  Neurological:     Mental Status: She is alert.  Psychiatric:        Behavior: Behavior normal.        Thought Content: Thought content normal.     ED Results / Procedures / Treatments   Labs (all labs ordered are listed, but only abnormal results are displayed) Labs Reviewed  BASIC METABOLIC PANEL - Abnormal; Notable for the following components:      Result Value   Glucose, Bld 112 (*)    BUN 28 (*)    Creatinine, Ser 1.40 (*)    GFR calc non Af Amer 34 (*)    GFR calc Af Amer 40 (*)    All other components within normal limits  URINALYSIS, ROUTINE W REFLEX MICROSCOPIC - Abnormal; Notable for the following components:   Color, Urine AMBER (*)    Protein, ur 100 (*)    Bacteria, UA RARE (*)    All other components within normal limits  COMPREHENSIVE METABOLIC PANEL - Abnormal; Notable for the following components:   CO2 18 (*)    Glucose, Bld 130 (*)    BUN 31 (*)    Creatinine, Ser 1.47 (*)    Total Protein 6.2 (*)    Total Bilirubin 1.7 (*)    GFR calc non Af Amer 32 (*)    GFR calc Af Amer 38 (*)    All other components within normal limits  BRAIN NATRIURETIC PEPTIDE - Abnormal; Notable for the following components:   B Natriuretic Peptide 1,428.9 (*)    All other components within normal limits  VITAMIN B12 - Abnormal; Notable for the following components:   Vitamin B-12 1,295 (*)    All other components within normal limits  CBG MONITORING, ED - Abnormal; Notable for the following components:   Glucose-Capillary 107 (*)    All other components within normal limits  TROPONIN I (HIGH SENSITIVITY) - Abnormal; Notable for the following components:   Troponin I (High Sensitivity) 28 (*)    All other components within normal limits  TROPONIN I (HIGH SENSITIVITY) - Abnormal; Notable for  the following components:   Troponin I (High Sensitivity) 23 (*)    All other components within normal limits  TROPONIN I (HIGH SENSITIVITY) - Abnormal; Notable for the following components:   Troponin I (High Sensitivity) 23 (*)    All other components within normal limits  TROPONIN I (HIGH SENSITIVITY) - Abnormal; Notable for the following components:   Troponin I (High Sensitivity) 21 (*)    All other components within normal limits  CBC WITH DIFFERENTIAL/PLATELET  MAGNESIUM  TSH  AMMONIA  PROCALCITONIN  CBC WITH DIFFERENTIAL/PLATELET  CBC WITH DIFFERENTIAL/PLATELET  RAPID URINE DRUG SCREEN, HOSP PERFORMED                                                                                           32 (*)    GFR calc Af Amer 38 (*)    All other components within normal limits  BRAIN NATRIURETIC PEPTIDE - Abnormal; Notable for the following components:   B Natriuretic Peptide 1,428.9 (*)    All other components within normal limits  VITAMIN B12 - Abnormal; Notable for the  following components:   Vitamin B-12 1,295 (*)    All other components within normal limits  CBG MONITORING, ED - Abnormal; Notable for the following components:   Glucose-Capillary 107 (*)    All other components within normal limits  TROPONIN I (HIGH SENSITIVITY) - Abnormal; Notable for the following components:   Troponin I (High Sensitivity) 28 (*)    All other components within normal limits  TROPONIN I (HIGH SENSITIVITY) - Abnormal; Notable for the following components:   Troponin I (High Sensitivity) 23 (*)    All other components within normal limits  TROPONIN I (HIGH SENSITIVITY) - Abnormal; Notable for the following components:   Troponin I (High Sensitivity) 23 (*)    All other components within normal limits  TROPONIN I (HIGH SENSITIVITY) - Abnormal; Notable for the following components:   Troponin I (High Sensitivity) 21 (*)    All other components within normal limits  CBC  WITH DIFFERENTIAL/PLATELET  MAGNESIUM  TSH  AMMONIA  PROCALCITONIN  CBC WITH DIFFERENTIAL/PLATELET  CBC WITH DIFFERENTIAL/PLATELET  RAPID URINE DRUG SCREEN, HOSP PERFORMED    EKG EKG Interpretation  Date/Time:  Sunday July 01 2019 16:04:17 EDT Ventricular Rate:  113 PR Interval:    QRS Duration: 146 QT Interval:  361 QTC Calculation: 495 R Axis:   56 Text Interpretation: Atrial fibrillation IVCD, consider atypical RBBB Anteroseptal infarct, age indeterminate Confirmed by ,  ([id number]) on 07/01/2019 4:39:22 PM   Radiology DG Chest Portable 1 View  Result Date: 07/01/2019 CLINICAL DATA:  Chest pain EXAM: PORTABLE CHEST 1 VIEW COMPARISON:  Chest radiograph dated 07/04/2017 FINDINGS: The heart remains enlarged. Vascular calcifications are seen in the aortic arch. There are trace bilateral pleural effusions with associated atelectasis/airspace disease. There is no pneumothorax. The osseous structures appear intact. IMPRESSION: Cardiomegaly. Trace bilateral pleural effusions with associated atelectasis/airspace disease. Electronically Signed   By: Tyler  Litton M.D.   On: 07/01/2019 17:36    Procedures Procedures (including critical care time)  Medications Ordered in ED Medications  haloperidol lactate (HALDOL) injection 2.5 mg (has no administration in time range)  LORazepam (ATIVAN) injection 0.5 mg (has no administration in time range)  apixaban (ELIQUIS) tablet 2.5 mg (has no administration in time range)  metoprolol tartrate (LOPRESSOR) injection 5 mg (has no administration in time range)  metoprolol tartrate (LOPRESSOR) injection 5 mg (5 mg Intravenous Given 07/01/19 1627)    ED Course  I have reviewed the triage vital signs and the nursing notes.  Pertinent labs & imaging results that were available during my care of the patient were reviewed by me and considered in my medical decision making (see chart for details).    MDM Rules/Calculators/A&P                       86  year old female with some chest discomfort and dyspnea.  I suspect she may be symptomatic with her atrial fibrillation.  She has a history of the same.  Appears to been in sinus rhythm during her last cardiology evaluation.  She is not a candidate for ED cardioversion given unclear symptom onset and probable noncompliance with anticoagulation.  She was given some IV metoprolol with improvement.  Troponin is minimally elevated.  She has not been complaining of any chest pain since arriving here to the emergency room.  Apparently has anaphylaxis with aspirin.    She is pretty confused and it is hard to get a great history from her.  Her son reports progressive confusion  over the past several months.  She currently lives alone.  They are interested in alternative living arrangements for her. I think that this would be most appropriate.  With her recent complaints of CP and dyspnea, I think the most prudent thing would be admit her for observation tonight for observation on telemetry and trending her enzymes.  May potentially need placement pending further input from her son who is driving into town.  Final Clinical Impression(s) / ED Diagnoses Final diagnoses:  Atrial fibrillation with rapid ventricular response [facility])    Rx / DC Orders ED Discharge Orders    None       Virgel Manifold, MD 07/02/19 [phone]

## 2019-07-01 NOTE — ED Notes (Signed)
Pt found by this RN taking all of her cords off and upset that she was tied down. RN got ED MD to come talk to pt and after letting pt talk to me for a while she agreed to 5 lead and BP cuff.

## 2019-07-01 NOTE — ED Notes (Signed)
Pt continues to wander halls. Redirected pt to room, provided warm blanket for comfort measures. Will continue to monitor.

## 2019-07-01 NOTE — ED Notes (Signed)
This tech found the pt walking around the nurses station completely dressed in her clothing. Pt stated that she is "ready to go home" but then sat down on one of the stretches in the hallway stating, "I don't feel so well." Pt was taken back to the room and placed back in the stretcher. EDP, [place], has been notified. Pt is not on the monitor at this moment.

## 2019-07-01 NOTE — ED Notes (Signed)
Requested eliquis from pharmacy.

## 2019-07-01 NOTE — H&P (Addendum)
History and Physical    Monica Lyons [id number] DOB: 10-Jul-1932 DOA: 07/01/2019  PCP: Secundino Ginger, PA-C  Patient coming from: Home.  Chief Complaint: Chest pain and increasing confusion.  Most of the history was obtained from patient's son no ER physician had discussed with.  Patient's son is on the way to ER from [place].  HPI: Monica Lyons is a 86 y.o. female with history of nonischemic cardiomyopathy post last EF had improved to 60 to 65% in 2019 after control of heart rate with history of A. fib, chronic anticoagulation CAD was brought to the ER after patient was found to be complaining of chest pain as noted by patient's neighbor and also had complained to patient's son.  Patient also was found to be progressively getting more confused and not sure if she was taking her medications.  ED Course: In the ER patient appears confused and is not able to provide proper history.  Patient did complain of chest pain to her neighbor and her son.  In the ER patient is found to be in A. fib with RVR 1 dose of metoprolol 5 mg IV was given.  Chest x-ray shows possible infiltrates versus congestion.  Labs show high sensitive troponin of 28 CBC unremarkable creatinine 1.4 which appears to be baseline.  TSH is 4.3.  On my exam patient is oriented to name but otherwise confused.  Moving all extremities.  Review of Systems: As per HPI, rest all negative.   Past Medical History:  Diagnosis Date  . Asthma   . Breast cancer ([place])   . Cancer ([place])   . Hypertension   . Osteopenia 04/2012   T score -2.0 FRAX 31%/19%  . Personal history of chemotherapy     Past Surgical History:  Procedure Laterality Date  . APPENDECTOMY    . AUGMENTATION MAMMAPLASTY Left   . BREAST SURGERY     Mastectomy  . CARDIOVERSION N/A 09/27/2017   Procedure: CARDIOVERSION;  Surgeon: Jolaine Artist, MD;  Location: [place];  Service: Cardiovascular;  Laterality: N/A;  . LEFT HEART CATH AND  CORONARY ANGIOGRAPHY N/A 07/11/2017   Procedure: LEFT HEART CATH AND CORONARY ANGIOGRAPHY;  Surgeon: Lorretta Harp, MD;  Location: [facility];  Service: Cardiovascular;  Laterality: N/A;  . MASTECTOMY Left 1996  . NASAL SEPTUM SURGERY       reports that she has never smoked. She has never used smokeless tobacco. She reports that she does not drink alcohol or use drugs.  Allergies  Allergen Reactions  . Aspirin Anaphylaxis  . Bee Venom Anaphylaxis  . Erythromycin Anaphylaxis  . Other Anaphylaxis and Other (See Comments)    [place]. CAUSES THROAT TO SWELL SHUT!!!  . Nutmeg Oil (Myristica Oil) Nausea And Vomiting  . Zoster Vaccine Live Other (See Comments)    Mycin family  . Amlodipine Besylate Other (See Comments)    Muscle cramps - pt unaware of allergy  . Chlorthalidone Other (See Comments)    Hyponatremia - pt unaware of allergy  . Diltiazem Hcl Other (See Comments)    Syncope - pt unaware of allergy  . Morphine And Related Nausea And Vomiting  . Sulfa Antibiotics Nausea And Vomiting  . Symbicort [Budesonide-Formoterol Fumarate] Other (See Comments)    Unknown  . Talc Other (See Comments)    Blood comes out of fingernails  . Tape Hives    Family History  Problem Relation Age of Onset  . Rheum arthritis Mother   .  Cancer Mother        bladder  . Diabetes Mother   . Hypertension Mother   . Hyperlipidemia Father   . Diabetes Maternal Aunt   . Asthma Maternal Grandfather   . Diabetes Maternal Grandfather     Prior to Admission medications   Medication Sig Start Date End Date Taking? Authorizing Provider  albuterol (PROVENTIL HFA;VENTOLIN HFA) 108 (90 BASE) MCG/ACT inhaler Inhale 2 puffs into the lungs every 6 (six) hours as needed for wheezing or shortness of breath.     [provider]  Cholecalciferol (VITAMIN D3) 2000 units TABS Take 2,000 Units by mouth daily.     [provider]  ELIQUIS 2.5 MG TABS tablet TAKE ONE TABLET BY MOUTH TWICE A  DAY Patient taking differently: Take 2.5 mg by mouth 2 (two) times daily.  02/12/19   Lorretta Harp, MD  EPINEPHrine 0.3 mg/0.3 mL IJ SOAJ injection Inject 0.3 mg into the muscle once.     [provider]  fexofenadine (ALLEGRA) 180 MG tablet Take 180 mg by mouth daily as needed for allergies.     [provider]  fluticasone (FLOVENT HFA) 110 MCG/ACT inhaler Inhale 1 puff into the lungs 2 (two) times daily.    [provider]  losartan (COZAAR) 50 MG tablet Take 50 mg by mouth daily. 06/11/19   [provider]  sacubitril-valsartan (ENTRESTO) 49-51 MG Take 1 tablet by mouth 2 (two) times daily. PT OVERDUE FOR OV PLEASE CALL FOR APPT 02/08/19   Bensimhon, Shaune Pascal, MD  vitamin B-12 (CYANOCOBALAMIN) 500 MCG tablet Take 500 mcg by mouth 2 (two) times daily.    [provider]    Physical Exam: Constitutional: Moderately built and nourished. Vitals:   07/01/19 2210 07/01/19 2217 07/01/19 2230 07/01/19 2300  BP: (!) 162/106 (!) 162/106 (!) 142/96 (!) 149/91  Pulse: (!) 110  (!) 106 97  Resp:   13 16  Temp:      TempSrc:      SpO2: 97%  96% 90%  Weight:      Height:       Eyes: Anicteric no pallor. ENMT: No discharge from the ears eyes nose or mouth. Neck: No mass felt.  No neck rigidity. Respiratory: No rhonchi or crepitations. Cardiovascular: S1-S2 heard. Abdomen: Soft nontender bowel sound present. Musculoskeletal: No edema. Skin: No rash. Neurologic: Alert awake oriented to her name.  Moving all extremities. Psychiatric: Appears confused.   Labs on Admission: I have personally reviewed following labs and imaging studies  CBC: Recent Labs  Lab 07/01/19 1607  WBC 7.5  NEUTROABS 5.5  HGB 13.4  HCT 41.2  MCV 89.6  PLT [id number]   Basic Metabolic Panel: Recent Labs  Lab 07/01/19 1607  NA 138  K 3.8  CL 105  CO2 22  GLUCOSE 112*  BUN 28*  CREATININE 1.40*  CALCIUM 9.3  MG 1.9   GFR: Estimated Creatinine Clearance: 23.3  mL/min (A) (by C-G formula based on SCr of 1.4 mg/dL (H)). Liver Function Tests: No results for input(s): AST, ALT, ALKPHOS, BILITOT, PROT, ALBUMIN in the last 168 hours. No results for input(s): LIPASE, AMYLASE in the last 168 hours. No results for input(s): AMMONIA in the last 168 hours. Coagulation Profile: No results for input(s): INR, PROTIME in the last 168 hours. Cardiac Enzymes: No results for input(s): CKTOTAL, CKMB, CKMBINDEX, TROPONINI in the last 168 hours. BNP (last 3 results) No results for input(s): PROBNP in the last 8760 hours.  HbA1C: No results for input(s): HGBA1C in the last 72 hours. CBG: Recent Labs  Lab 07/01/19 1605  GLUCAP 107*   Lipid Profile: No results for input(s): CHOL, HDL, LDLCALC, TRIG, CHOLHDL, LDLDIRECT in the last 72 hours. Thyroid Function Tests: No results for input(s): TSH, T4TOTAL, FREET4, T3FREE, THYROIDAB in the last 72 hours. Anemia Panel: No results for input(s): VITAMINB12, FOLATE, FERRITIN, TIBC, IRON, RETICCTPCT in the last 72 hours. Urine analysis:    Component Value Date/Time   COLORURINE YELLOW 04/30/2014 1623   APPEARANCEUR CLEAR 04/30/2014 1623   LABSPEC 1.020 04/30/2014 1623   PHURINE 5.5 04/30/2014 1623   GLUCOSEU NEG 04/30/2014 1623   HGBUR NEG 04/30/2014 1623   BILIRUBINUR NEG 04/30/2014 1623   KETONESUR NEG 04/30/2014 1623   PROTEINUR NEG 04/30/2014 1623   UROBILINOGEN 0.2 04/30/2014 1623   NITRITE NEG 04/30/2014 1623   LEUKOCYTESUR NEG 04/30/2014 1623   Sepsis Labs: @LABRCNTIP (procalcitonin:4,lacticidven:4) )No results found for this or any previous visit (from the past 240 hour(s)).   Radiological Exams on Admission: DG Chest Portable 1 View  Result Date: 07/01/2019 CLINICAL DATA:  Chest pain EXAM: PORTABLE CHEST 1 VIEW COMPARISON:  Chest radiograph dated 07/04/2017 FINDINGS: The heart remains enlarged. Vascular calcifications are seen in the aortic arch. There are trace bilateral pleural effusions with  associated atelectasis/airspace disease. There is no pneumothorax. The osseous structures appear intact. IMPRESSION: Cardiomegaly. Trace bilateral pleural effusions with associated atelectasis/airspace disease. Electronically Signed   By: Zerita Boers M.D.   On: 07/01/2019 17:36    EKG: Independently reviewed.  A. fib with RVR.  Assessment/Plan Principal Problem:   Chest pain Active Problems:   Moderate pulmonary arterial systolic hypertension (HCC)   Coronary artery disease   Atrial fibrillation with RVR ([place])    1. Acute encephalopathy cause not clear.  Could be possible dementia.  Given the acute worsening will check CT head and possible need for MRI check B12 ammonia levels. 2. A. fib with RVR not sure if patient has been taking any of rate limiting medication.  Per recent cardiology notes in November patient has not been on any rate limiting medications.  Patient did receive metoprolol IV 1 dose in the ER I have written metoprolol 25 mg p.o. twice daily.  Closely monitor heart rate per patient is on apixaban for anticoagulation. 3. Had complained of intermittent chest pain presently chest pain-free.  Could be from A. fib with RVR.  Continue to cycle cardiac markers. 4. Hypertension uncontrolled presently placed patient on metoprolol.  Follow blood pressure trends. 5. Chest x-ray shows possible infiltrates versus congestion.  For which I have ordered procalcitonin BNP and also place patient on doxycycline for now which can be discontinued if procalcitonin is negative. 6. Non ischemic cardiomyopathy last EF improved after control of heart rate in 2019.  If BNP is elevated may need to repeat 2D echo. 7. Chronic kidney disease stage III creatinine appears to be at baseline.  Follow metabolic panel   DVT prophylaxis: Apixaban. Code Status: Full code. Family Communication: Patient's son is on the way to [place] from [place] will need to have further discussion. Disposition Plan: To be  determined. Consults called: None. Admission status: Observation.   Rise Patience MD [facility] Pager [phone].  If 7PM-7AM, please contact night-coverage [email or web address] Password [facility]  07/01/2019, 11:07 PM

## 2019-07-02 ENCOUNTER — Observation Stay (HOSPITAL_COMMUNITY): Payer: Medicare PPO

## 2019-07-02 ENCOUNTER — Encounter (HOSPITAL_COMMUNITY): Payer: Self-pay | Admitting: Internal Medicine

## 2019-07-02 ENCOUNTER — Ambulatory Visit (HOSPITAL_BASED_OUTPATIENT_CLINIC_OR_DEPARTMENT_OTHER): Payer: Medicare PPO

## 2019-07-02 DIAGNOSIS — F0391 Unspecified dementia with behavioral disturbance: Secondary | ICD-10-CM | POA: Diagnosis not present

## 2019-07-02 DIAGNOSIS — I361 Nonrheumatic tricuspid (valve) insufficiency: Secondary | ICD-10-CM | POA: Diagnosis not present

## 2019-07-02 DIAGNOSIS — N182 Chronic kidney disease, stage 2 (mild): Secondary | ICD-10-CM | POA: Diagnosis present

## 2019-07-02 DIAGNOSIS — I4891 Unspecified atrial fibrillation: Secondary | ICD-10-CM | POA: Insufficient documentation

## 2019-07-02 DIAGNOSIS — I340 Nonrheumatic mitral (valve) insufficiency: Secondary | ICD-10-CM | POA: Diagnosis not present

## 2019-07-02 DIAGNOSIS — J90 Pleural effusion, not elsewhere classified: Secondary | ICD-10-CM | POA: Diagnosis present

## 2019-07-02 DIAGNOSIS — F0390 Unspecified dementia without behavioral disturbance: Secondary | ICD-10-CM | POA: Diagnosis present

## 2019-07-02 DIAGNOSIS — R9389 Abnormal findings on diagnostic imaging of other specified body structures: Secondary | ICD-10-CM | POA: Diagnosis present

## 2019-07-02 DIAGNOSIS — I428 Other cardiomyopathies: Secondary | ICD-10-CM

## 2019-07-02 LAB — URINALYSIS, ROUTINE W REFLEX MICROSCOPIC
Bilirubin Urine: NEGATIVE
Glucose, UA: NEGATIVE mg/dL
Hgb urine dipstick: NEGATIVE
Ketones, ur: NEGATIVE mg/dL
Leukocytes,Ua: NEGATIVE
Nitrite: NEGATIVE
Protein, ur: 100 mg/dL — AB
Specific Gravity, Urine: 1.026 (ref 1.005–1.030)
pH: 5 (ref 5.0–8.0)

## 2019-07-02 LAB — ECHOCARDIOGRAM COMPLETE
Height: 67 in
Weight: 1742.52 oz

## 2019-07-02 LAB — COMPREHENSIVE METABOLIC PANEL
ALT: 17 U/L (ref 0–44)
AST: 28 U/L (ref 15–41)
Albumin: 3.5 g/dL (ref 3.5–5.0)
Alkaline Phosphatase: 61 U/L (ref 38–126)
Anion gap: 15 (ref 5–15)
BUN: 31 mg/dL — ABNORMAL HIGH (ref 8–23)
CO2: 18 mmol/L — ABNORMAL LOW (ref 22–32)
Calcium: 9.4 mg/dL (ref 8.9–10.3)
Chloride: 105 mmol/L (ref 98–111)
Creatinine, Ser: 1.47 mg/dL — ABNORMAL HIGH (ref 0.44–1.00)
GFR calc Af Amer: 38 mL/min — ABNORMAL LOW (ref >60)
GFR calc non Af Amer: 32 mL/min — ABNORMAL LOW (ref >60)
Glucose, Bld: 130 mg/dL — ABNORMAL HIGH (ref 70–99)
Potassium: 4.7 mmol/L (ref 3.5–5.1)
Sodium: 138 mmol/L (ref 135–145)
Total Bilirubin: 1.7 mg/dL — ABNORMAL HIGH (ref 0.3–1.2)
Total Protein: 6.2 g/dL — ABNORMAL LOW (ref 6.5–8.1)

## 2019-07-02 LAB — TSH: TSH: 4.316 u[IU]/mL (ref 0.350–4.500)

## 2019-07-02 LAB — TROPONIN I (HIGH SENSITIVITY)
Troponin I (High Sensitivity): 23 ng/L — ABNORMAL HIGH (ref <18)
Troponin I (High Sensitivity): 21 ng/L — ABNORMAL HIGH (ref <18)

## 2019-07-02 LAB — RAPID URINE DRUG SCREEN, HOSP PERFORMED
Amphetamines: NOT DETECTED
Barbiturates: NOT DETECTED
Benzodiazepines: NOT DETECTED
Cocaine: NOT DETECTED
Opiates: NOT DETECTED
Tetrahydrocannabinol: NOT DETECTED

## 2019-07-02 LAB — PROCALCITONIN: Procalcitonin: <0.1 ng/mL

## 2019-07-02 LAB — BRAIN NATRIURETIC PEPTIDE: B Natriuretic Peptide: 1428.9 pg/mL — ABNORMAL HIGH (ref 0.0–100.0)

## 2019-07-02 LAB — VITAMIN B12: Vitamin B-12: 1295 pg/mL — ABNORMAL HIGH (ref 180–914)

## 2019-07-02 LAB — AMMONIA: Ammonia: 30 umol/L (ref 9–35)

## 2019-07-02 MED ORDER — SODIUM CHLORIDE 0.9 % IV SOLN
100.0000 mg | Freq: Two times a day (BID) | INTRAVENOUS | Status: DC
  Administered 2019-07-02 – 2019-07-03 (×3): 100 mg via INTRAVENOUS
  Filled 2019-07-02 (×4): qty 100

## 2019-07-02 MED ORDER — LORAZEPAM 2 MG/ML IJ SOLN
0.5000 mg | Freq: Once | INTRAMUSCULAR | Status: AC
  Administered 2019-07-02: 0.5 mg via INTRAVENOUS
  Filled 2019-07-02: qty 1

## 2019-07-02 MED ORDER — BUDESONIDE 0.25 MG/2ML IN SUSP
0.2500 mg | Freq: Two times a day (BID) | RESPIRATORY_TRACT | Status: DC
  Administered 2019-07-02 – 2019-07-05 (×7): 0.25 mg via RESPIRATORY_TRACT
  Filled 2019-07-02 (×10): qty 2

## 2019-07-02 NOTE — Care Management Obs Status (Signed)
MEDICARE OBSERVATION STATUS NOTIFICATION   Patient Details  Name: Monica Lyons MRN: [id number] Date of Birth: [birth year]   Medicare Observation Status Notification Given:  Yes  Signed by son. Pt not oriented.  Rae Mar, RN 07/02/2019, 1:30 PM

## 2019-07-02 NOTE — ED Notes (Signed)
Pt very altered. Continues to get out of bed, requesting her shoes to be put on so she can go outside and see her neighbor. Redirect pt multiple times back to bed.

## 2019-07-02 NOTE — Progress Notes (Addendum)
Progress Note    Monica Lyons  [id number] DOB: 02-Jul-1932  DOA: 07/01/2019 PCP: Secundino Ginger, PA-C    Brief Narrative:   Chief complaint: increasing confusion  Medical records reviewed and are as summarized below:  Monica Lyons is an 86 y.o. female has medical history that includes nonischemic cardiomyopathy, atrial fibrillation, CAD, dementia presents to the emergency department planing of chest pain and increasing confusion.  Information is obtained from the chart and the son.  Reportedly patient noted by a neighbor wandering the neighborhood and complained of chest pain.  Son reports worsening of dementia over the last 4 weeks at least.  Work-up includes a chest x-ray questionable for infiltrate, no metabolic derangement, CT of the head negative  Assessment/Plan:   Principal Problem:   Chronic dementia (HCC) Active Problems:   Chest pain   Atrial fibrillation with RVR (HCC)   NICM (nonischemic cardiomyopathy) (HCC)   Hypertension   Paroxysmal atrial fibrillation (HCC)   Moderate pulmonary arterial systolic hypertension (HCC)   Coronary artery disease   Bilateral pleural effusion   Abnormal chest x-ray   CKD (chronic kidney disease), stage II   #1.  Chronic  with likely progression noted over the last several weeks by intermittent inability to recognize family members and wandering at night.  She lives alone continues to drive and manage her medications.  Family indicates she does not manage her money at this point but still drives.  CT of the head without acute abnormalities.  MRI of the brain without acute abnormalities.  Chest x-ray questionable for infiltrate so antibiotics were initiated still waiting on a urinalysis she is afebrile no leukocytosis no metabolic derangement.  BNP is elevated (patient is on Entresto) -Follow B12 and ammonia level -Obtain a urine for urinalysis -Follow pro calcitonin -continue doxy for now -PT and OT evaluation  for possible placement  #2.  Chest pain.  Has a history of nonischemic cardiomyopathy, A. fib and CAD.  Patient denies pain this morning.  Reportedly she complained of chest pain to family and neighbors.  High-sensitivity troponin slightly elevated but flat.  EKG with A. fib at a rate of 113.  Home medications include losartan, Entresto and Eliquis. -echo -continue to hold entresto -continue eliquis -Continue metoprolol and losartan -daily weight -intake and output  #3.  Atrial fibrillation with RVR.  Likely related to noncompliance with medications secondary to above.  She received IV metoprolol in the emergency department.  Home medications include no controlling drugs.  Metoprolol p.o. twice daily was started -Continue metoprolol -Echo as noted above -Continue Eliquis  #4.  Hypertension.  Home medications include losartan.  Chart review indicates she was on a beta-blocker in the past but complained of photophobia.  Blood pressure high end of normal in the emergency department. -Continue metoprolol -Continue losartan -Monitor  #5.  Abnormal chest x-ray.  Chest x-ray questionable for possible infiltrate versus congestion and shows trace bilateral pleural effusions.  Procalcitonin pending and doxy was started.  She is afebrile hemodynamically stable and nontoxic-appearing.  No leukocytosis. -Continue doxy for now -Monitor oxygen saturation level -We will try incentive spirometry -Follow pro calcitonin  #6.  Nonischemic cardiomyopathy.  Chart review indicates she saw cardiology in November 2020.  That progress note indicated echo had shown improvement in EF to 60% after she underwent cardioversion for A. Fib -We will obtain an echo. -Note she has an elevated BNP but home medications do include Entresto.  What is unclear is how compliant  she has been with her medications in the setting of #1  #7.  Chronic kidney disease stage II.  Creatinine is 1.4 on presentation this appears to be  chronic per chart review and close to baseline -Hold nephrotoxins -Monitor urine output -Recheck in the morning    Family Communication/Anticipated D/C date and plan/Code Status   DVT prophylaxis: eliquis ordered. Code Status: Full Code.  Family Communication: son on phone Disposition Plan:  To be determined but will likely require 24/7 supervision.  Hopefully ready for discharge 24 to 48 hours once evaluation for infectious process and work-up for chest pain complete   Medical Consultants:   None.    Subjective:   Awake smiling. Does not follow commands is talking but not to me.   Objective:    Vitals:   07/02/19 0000 07/02/19 0309 07/02/19 0630 07/02/19 0748  BP: (!) 119/98 (!) 178/101 (!) 135/99 (!) 151/108  Pulse: (!) 115  95 90  Resp:   16 18  Temp:   98.4 F (36.9 C) 97.7 F (36.5 C)  TempSrc:   Oral   SpO2: 94%  97% 98%  Weight:      Height:       No intake or output data in the 24 hours ending 07/02/19 1105 Filed Weights   07/01/19 1617  Weight: 49.4 kg    Exam: General: Somewhat thin and frail-appearing she is alert in no acute distress CV: Irregularly irregular no murmur gallop or rub no lower extremity edema Respiratory: No increased work of breathing breath sounds are distant but clear difficulty following commands for exam I hear no wheezes no rhonchi Abdomen: Flat soft positive bowel sounds throughout nontender to palpation no guarding or rebounding Musculoskeletal: Joints without swelling/erythema full range of motion Neuro: Alert oriented to self only unable to follow simple commands unable to make her wants and needs known moves all extremities spontaneously wandering the halls not agitated at the time of my exam  Data Reviewed:   I have personally reviewed following labs and imaging studies:  Labs: Labs show the following:   Basic Metabolic Panel: Recent Labs  Lab 07/01/19 1607 07/02/19 0849  NA 138 138  K 3.8 4.7  CL 105 105   CO2 22 18*  GLUCOSE 112* 130*  BUN 28* 31*  CREATININE 1.40* 1.47*  CALCIUM 9.3 9.4  MG 1.9  --    GFR Estimated Creatinine Clearance: 22.2 mL/min (A) (by C-G formula based on SCr of 1.47 mg/dL (H)). Liver Function Tests: Recent Labs  Lab 07/02/19 0849  AST 28  ALT 17  ALKPHOS 61  BILITOT 1.7*  PROT 6.2*  ALBUMIN 3.5   No results for input(s): LIPASE, AMYLASE in the last 168 hours. Recent Labs  Lab 07/02/19 0712  AMMONIA 30   Coagulation profile No results for input(s): INR, PROTIME in the last 168 hours.  CBC: Recent Labs  Lab 07/01/19 1607  WBC 7.5  NEUTROABS 5.5  HGB 13.4  HCT 41.2  MCV 89.6  PLT 209   Cardiac Enzymes: No results for input(s): CKTOTAL, CKMB, CKMBINDEX, TROPONINI in the last 168 hours. BNP (last 3 results) No results for input(s): PROBNP in the last 8760 hours. CBG: Recent Labs  Lab 07/01/19 1605  GLUCAP 107*   D-Dimer: No results for input(s): DDIMER in the last 72 hours. Hgb A1c: No results for input(s): HGBA1C in the last 72 hours. Lipid Profile: No results for input(s): CHOL, HDL, LDLCALC, TRIG, CHOLHDL, LDLDIRECT in the last 72  hours. Thyroid function studies: Recent Labs    07/02/19 0013  TSH 4.316   Anemia work up: Recent Labs    07/02/19 0849  VITAMINB12 1,295*   Sepsis Labs: Recent Labs  Lab 07/01/19 1607  WBC 7.5    Microbiology No results found for this or any previous visit (from the past 240 hour(s)).  Procedures and diagnostic studies:  CT HEAD WO CONTRAST  Result Date: 07/02/2019 CLINICAL DATA:  Altered mental status.  Vertigo when laying flat EXAM: CT HEAD WITHOUT CONTRAST TECHNIQUE: Contiguous axial images were obtained from the base of the skull through the vertex without intravenous contrast. COMPARISON:  None. FINDINGS: Brain: No evidence of acute infarction, hemorrhage, hydrocephalus, extra-axial collection or mass lesion/mass effect. Cortical atrophy which is generalized. Mild chronic small  vessel ischemia in the periventricular white matter. Vascular: No hyperdense vessel or unexpected calcification. Skull: Normal. Negative for fracture or focal lesion. Sinuses/Orbits: Negative. IMPRESSION: Aging brain without acute finding. Electronically Signed   By: Monte Fantasia M.D.   On: 07/02/2019 04:55   MR BRAIN WO CONTRAST  Result Date: 07/02/2019 CLINICAL DATA:  Encephalopathy EXAM: MRI HEAD WITHOUT CONTRAST TECHNIQUE: Multiplanar, multiecho pulse sequences of the brain and surrounding structures were obtained without intravenous contrast. COMPARISON:  None. FINDINGS: Motion degraded with sagittal T1, axial T2, axial DWI, axial T2 FLAIR sequences attempted. Brain: There is no acute infarction. No mass effect. Prominence of the ventricles and sulci reflecting parenchymal volume loss. Vascular: Flow voids at the skull base where adequately visualized are preserved. Skull and upper cervical spine: No aggressive osseous lesion. Sinuses/Orbits: Aerated. Other: None. IMPRESSION: Motion degraded, partial study.  No acute infarction. Electronically Signed   By: Macy Mis M.D.   On: 07/02/2019 10:15   DG Chest Portable 1 View  Result Date: 07/01/2019 CLINICAL DATA:  Chest pain EXAM: PORTABLE CHEST 1 VIEW COMPARISON:  Chest radiograph dated 07/04/2017 FINDINGS: The heart remains enlarged. Vascular calcifications are seen in the aortic arch. There are trace bilateral pleural effusions with associated atelectasis/airspace disease. There is no pneumothorax. The osseous structures appear intact. IMPRESSION: Cardiomegaly. Trace bilateral pleural effusions with associated atelectasis/airspace disease. Electronically Signed   By: Zerita Boers M.D.   On: 07/01/2019 17:36    Medications:    apixaban  2.5 mg Oral Once   apixaban  2.5 mg Oral BID   budesonide (PULMICORT) nebulizer solution  0.25 mg Nebulization BID   losartan  50 mg Oral Daily   metoprolol tartrate  25 mg Oral BID   vitamin B-12  500  mcg Oral BID   Continuous Infusions:  doxycycline (VIBRAMYCIN) IV 100 mg (07/02/19 1015)     LOS: 0 days   Radene Gunning NP  [facility]   How to contact the [facility] Attending or Consulting provider [place] or covering provider during after hours [place], for this patient?  Check the care team in [facility] and look for a) attending/consulting TR[facility] provider listed and b) the [facility] team listed Log into [email or web address] and use [place]'s universal password to access. If you do not have the password, please contact the hospital operator. Locate the [facility] provider you are looking for under [facility] and page to a number that you can be directly reached. If you still have difficulty reaching the provider, please page the [facility] (Director on Call) for the Hospitalists listed on amion for assistance.  07/02/2019, 11:05 AM

## 2019-07-02 NOTE — ED Notes (Signed)
Lunch Tray Ordered @ 1010. 

## 2019-07-02 NOTE — TOC Progression Note (Signed)
CM and CSW spoke with pt's son at bedside.  Pt was present but was not aware or participative with the conversation.  CM and CSW discussed pt's orientation status vs. HPOA vs. Guardianship process.  Neither son or daughter (driving from [place] now) believes the pt has a HPOA.  Son reports pt has been too paranoid to sign a document like that and also did not recognize her daughter recently.  Pt's daughter manages pt's finances and pt does own her home.  Pt's son reports they had looked at several independent living locations in [facility] [place] (where he lives) but the pt was not interested in moving there at the time.  CM and CSW discussed HH vs. SNF rehab vs LTC private pay memory care ALF/SNFand inpt vs. obs stay qualifications. CM advised pt's son to contact the facilities they had already considered in his area for memory care ALF and learn their admission process, so that he can discuss in detail with his sister.  CM and CSW advised that if pt is transitioned home tomorrow, there is a very good chance that pt will not go to a facility directly from the hospital but with [facility] and a SW to help.  Pt's son was grateful for information but overwhelmed and had no further questions at the time.  He is driving back to [place] this evening and works Architectural technologist.  He reports his sister will be here late tonight and through the end of the week.  He requested CM/CSW TOC team speak with his sister again tomorrow and possibly have him on the phone so he can listen to the information a second time.  No further ED CM needs noted at this time but [facility] team will continue to follow.

## 2019-07-02 NOTE — ED Notes (Signed)
Requested secretary to page admitting regarding pt elevated BP.

## 2019-07-02 NOTE — Progress Notes (Signed)
  Echocardiogram 2D Echocardiogram has been performed.  Monica Lyons 07/02/2019, 2:08 PM

## 2019-07-02 NOTE — ED Notes (Signed)
Pt taken to MRI  

## 2019-07-03 DIAGNOSIS — I4891 Unspecified atrial fibrillation: Secondary | ICD-10-CM | POA: Diagnosis not present

## 2019-07-03 DIAGNOSIS — F0391 Unspecified dementia with behavioral disturbance: Secondary | ICD-10-CM | POA: Diagnosis not present

## 2019-07-03 LAB — CBC
HCT: 45.5 % (ref 36.0–46.0)
Hemoglobin: 14.9 g/dL (ref 12.0–15.0)
MCH: 29.6 pg (ref 26.0–34.0)
MCHC: 32.7 g/dL (ref 30.0–36.0)
MCV: 90.5 fL (ref 80.0–100.0)
Platelets: 218 10*3/uL (ref 150–400)
RBC: 5.03 MIL/uL (ref 3.87–5.11)
RDW: 14.4 % (ref 11.5–15.5)
WBC: 9.6 10*3/uL (ref 4.0–10.5)
nRBC: 0 % (ref 0.0–0.2)

## 2019-07-03 LAB — BASIC METABOLIC PANEL
Anion gap: 13 (ref 5–15)
BUN: 37 mg/dL — ABNORMAL HIGH (ref 8–23)
CO2: 19 mmol/L — ABNORMAL LOW (ref 22–32)
Calcium: 9.1 mg/dL (ref 8.9–10.3)
Chloride: 107 mmol/L (ref 98–111)
Creatinine, Ser: 1.63 mg/dL — ABNORMAL HIGH (ref 0.44–1.00)
GFR calc Af Amer: 33 mL/min — ABNORMAL LOW (ref >60)
GFR calc non Af Amer: 29 mL/min — ABNORMAL LOW (ref >60)
Glucose, Bld: 120 mg/dL — ABNORMAL HIGH (ref 70–99)
Potassium: 4.2 mmol/L (ref 3.5–5.1)
Sodium: 139 mmol/L (ref 135–145)

## 2019-07-03 LAB — SARS CORONAVIRUS 2 (TAT 6-24 HRS): SARS Coronavirus 2: NEGATIVE

## 2019-07-03 MED ORDER — SACUBITRIL-VALSARTAN 49-51 MG PO TABS
1.0000 | ORAL_TABLET | Freq: Two times a day (BID) | ORAL | Status: DC
  Administered 2019-07-03 – 2019-07-06 (×7): 1 via ORAL
  Filled 2019-07-03 (×7): qty 1

## 2019-07-03 NOTE — Progress Notes (Signed)
Progress Note    Monica Lyons  [id number] DOB: 08-24-1932  DOA: 07/01/2019 PCP: Secundino Ginger, PA-C    Brief Narrative:   Chief complaint: worsening confusion  Medical records reviewed and are as summarized below:  Monica Lyons is an 86 y.o. female with a past medical history that includes nonischemic cardiomyopathy, atrial fibrillation, CAD, dementia presented to the emergency department March 29 chief complaint chest pain and increasing confusion.  Information was obtained from the chart and the son on the telephone.  Reportedly patient noted by a neighbor wandering the neighborhood and complained of chest pain.  Son reported worsening dementia over the previous 4 weeks at least.  Work-up included a chest x-ray questionable for infiltrate no metabolic derangement CT of the head was negative.  After speaking with son and daughter it is clear that over the last several months patient's dementia has worsened to the point she was noncompliant with medications not participating in personal hygiene or domestic duties.  Assessment/Plan:   Principal Problem:   Chronic dementia ([place]) Active Problems:   Chest pain   Atrial fibrillation with RVR (HCC)   NICM (nonischemic cardiomyopathy) (HCC)   Hypertension   Paroxysmal atrial fibrillation (HCC)   Moderate pulmonary arterial systolic hypertension (HCC)   Coronary artery disease   Bilateral pleural effusion   Abnormal chest x-ray   CKD (chronic kidney disease), stage II  #1.  Chronic dementia with likely progression noted over the previous several weeks by intermittent inability to recognize family members and wandering at night.  She lives alone continues to drive and manage her medications.  Family indicated she does not manage her money at this point but still drives.  CT of the head without acute abnormalities.  MRI of the brain without acute abnormalities.  Vitamin B12 is elevated, ammonia level within the limits  of normal.  Procalcitonin within the limits of normal. Chest x-ray questionable for infiltrate so antibiotics were initiated. Urinalysis with protein and rare bacteria otherwise within the limits of normal.  She remains afebrile no leukocytosis no metabolic derangement.  BNP is elevated (patient is on Entresto) - will stop doxy  -PT and OT evaluation for possible placement  #2.  Chest pain.  Denies pain this morning. Has a history of nonischemic cardiomyopathy, A. fib and CAD.  Reportedly she complained of chest pain to family and neighbors.  High-sensitivity troponin slightly elevated but flat.  EKG with A. fib at a rate of 113.  Home medications include losartan, Entresto and Eliquis.  Echo reveals an ejection fraction of 30 to 35%, left ventricle moderately decreased function, left ventricle demonstrates global hypokinesis, mild left ventricular hypertrophy- -resume entresto -Stop losartan -continue eliquis -Continue metoprolol  -daily weight -intake and output  #3.  Atrial fibrillation with RVR.  Likely related to noncompliance with medications secondary to above.  She received IV metoprolol in the emergency department.  Home medications include no controlling drugs.  Metoprolol p.o. twice daily was started.  Continues with heart rate range in the 120s.  Blood pressure low end of normal. -Continue metoprolol -Continue Eliquis -Stop losartan and resume home Entresto. -Monitor closely -If no improvement consider increase of metoprolol assuming blood pressure allows  #4.  Hypertension.  Home medications include losartan.  Chart review indicates she was on a beta-blocker in the past but complained of photophobia.  Blood pressure high end of normal in the emergency department as was her heart rate.  Metoprolol was started and her home  losartan was resumed. -Continue metoprolol -Stop losartan -Resume home Entresto -See #3 -Monitor  #5.  Abnormal chest x-ray.  Chest x-ray questionable for  possible infiltrate versus congestion and shows trace bilateral pleural effusions.  Procalcitonin within the limits of normal. She remains afebrile hemodynamically stable and nontoxic-appearing.  No leukocytosis. -Discontinue doxy  -Monitor oxygen saturation level -We will try incentive spirometry  #6.  Nonischemic cardiomyopathy.  Chart review indicates she saw cardiology in November 2020.  That progress note indicated echo had shown improvement in EF to 60% after she underwent cardioversion for A. Fib.  Echo yesterday shows an EF of 30%. -Note she has an elevated BNP but home medications do include Entresto.  What is unclear is how compliant she has been with her medications in the setting of #1 -Medications as noted above -Daily weights  #7.  Chronic kidney disease stage II.  Creatinine is 1.4 on presentation and trending up somewhat to 1.63 today.  Chart review indicates her creatinine has been trending up gradually since 2 years ago.  -Hold nephrotoxins -Monitor urine output -Recheck in the morning     Family Communication/Anticipated D/C date and plan/Code Status   DVT prophylaxis: eliquis ordered. Code Status: Full Code.  Family Communication: Son and daughter at bedside Disposition Plan: Patient for SNF placement once blood pressure heart rate under control   Medical Consultants:    None.   Anti-Infectives:    Doxy 3/29-/3/30  Subjective:   Sitting in chair watching TV and smiling.  She denies any pain or discomfort noting that information obtained from patient is unreliable due to advanced dementia  Objective:    Vitals:   07/03/19 0057 07/03/19 0338 07/03/19 0916 07/03/19 1201  BP: (!) 134/97 (!) 164/101  110/82  Pulse: 87 98  (!) 128  Resp: 20 (!) 22  18  Temp:  98 F (36.7 C)  98 F (36.7 C)  TempSrc:  Axillary    SpO2:  96% 94% 94%  Weight:  50.9 kg    Height:        Intake/Output Summary (Last 24 hours) at 07/03/2019 1238 Last data filed at  07/03/2019 0900 Gross per 24 hour  Intake 401.72 ml  Output --  Net 401.72 ml   Filed Weights   07/01/19 1617 07/03/19 0338  Weight: 49.4 kg 50.9 kg    Exam: General: Slightly thin and frail-appearing sitting in chair no acute distress CV: Irregularly irregular.  No murmur gallop or rub no lower extremity edema Respiratory: No increased work of breathing breath sounds are clear bilaterally I hear no wheezes no rhonchi Musculoskeletal joints without swelling/erythema full range of motion Abdomen: Nondistended soft positive bowel sounds throughout nontender to palpation no mass organomegaly noted Neuro: Alert oriented to self only.  Basically noncommunicative.  Unable to follow simple commands, unable to make her wants and needs known, calm  Data Reviewed:   I have personally reviewed following labs and imaging studies:  Labs: Labs show the following:   Basic Metabolic Panel: Recent Labs  Lab 07/01/19 1607 07/01/19 1607 07/02/19 0849 07/03/19 0501  NA 138  --  138 139  K 3.8   < > 4.7 4.2  CL 105  --  105 107  CO2 22  --  18* 19*  GLUCOSE 112*  --  130* 120*  BUN 28*  --  31* 37*  CREATININE 1.40*  --  1.47* 1.63*  CALCIUM 9.3  --  9.4 9.1  MG 1.9  --   --   --    < > =  values in this interval not displayed.   GFR Estimated Creatinine Clearance: 20.6 mL/min (A) (by C-G formula based on SCr of 1.63 mg/dL (H)). Liver Function Tests: Recent Labs  Lab 07/02/19 0849  AST 28  ALT 17  ALKPHOS 61  BILITOT 1.7*  PROT 6.2*  ALBUMIN 3.5   No results for input(s): LIPASE, AMYLASE in the last 168 hours. Recent Labs  Lab 07/02/19 0712  AMMONIA 30   Coagulation profile No results for input(s): INR, PROTIME in the last 168 hours.  CBC: Recent Labs  Lab 07/01/19 1607 07/03/19 0501  WBC 7.5 9.6  NEUTROABS 5.5  --   HGB 13.4 14.9  HCT 41.2 45.5  MCV 89.6 90.5  PLT 209 218   Cardiac Enzymes: No results for input(s): CKTOTAL, CKMB, CKMBINDEX, TROPONINI in the  last 168 hours. BNP (last 3 results) No results for input(s): PROBNP in the last 8760 hours. CBG: Recent Labs  Lab 07/01/19 1605  GLUCAP 107*   D-Dimer: No results for input(s): DDIMER in the last 72 hours. Hgb A1c: No results for input(s): HGBA1C in the last 72 hours. Lipid Profile: No results for input(s): CHOL, HDL, LDLCALC, TRIG, CHOLHDL, LDLDIRECT in the last 72 hours. Thyroid function studies: Recent Labs    07/02/19 0013  TSH 4.316   Anemia work up: Recent Labs    07/02/19 0849  VITAMINB12 1,295*   Sepsis Labs: Recent Labs  Lab 07/01/19 1607 07/02/19 0849 07/03/19 0501  PROCALCITON  --  <0.10  --   WBC 7.5  --  9.6    Microbiology Recent Results (from the past 240 hour(s))  SARS CORONAVIRUS 2 (TAT 6-24 HRS) Nasopharyngeal Nasopharyngeal Swab     Status: None   Collection Time: 07/03/19  6:00 AM   Specimen: Nasopharyngeal Swab  Result Value Ref Range Status   SARS Coronavirus 2 NEGATIVE NEGATIVE Final    Comment: (NOTE) SARS-CoV-2 target nucleic acids are NOT DETECTED. The SARS-CoV-2 RNA is generally detectable in upper and lower respiratory specimens during the acute phase of infection. Negative results do not preclude SARS-CoV-2 infection, do not rule out co-infections with other pathogens, and should not be used as the sole basis for treatment or other patient management decisions. Negative results must be combined with clinical observations, patient history, and epidemiological information. The expected result is Negative. Fact Sheet for Patients: SugarRoll.be Fact Sheet for Healthcare Providers: https://www.woods-mathews.com/ This test is not yet approved or cleared by the [place] FDA and  has been authorized for detection and/or diagnosis of SARS-CoV-2 by FDA under an Emergency Use Authorization (EUA). This EUA will remain  in effect (meaning this test can be used) for the duration of the COVID-19  declaration under Section 56 4(b)(1) of the Act, 21 U.S.C. section 360bbb-3(b)(1), unless the authorization is terminated or revoked sooner. Performed at [facility], [place] [address], [place], [place] [address]     Procedures and diagnostic studies:  CT HEAD WO CONTRAST  Result Date: 07/02/2019 CLINICAL DATA:  Altered mental status.  Vertigo when laying flat EXAM: CT HEAD WITHOUT CONTRAST TECHNIQUE: Contiguous axial images were obtained from the base of the skull through the vertex without intravenous contrast. COMPARISON:  None. FINDINGS: Brain: No evidence of acute infarction, hemorrhage, hydrocephalus, extra-axial collection or mass lesion/mass effect. Cortical atrophy which is generalized. Mild chronic small vessel ischemia in the periventricular white matter. Vascular: No hyperdense vessel or unexpected calcification. Skull: Normal. Negative for fracture or focal lesion. Sinuses/Orbits: Negative. IMPRESSION: Aging brain  without acute finding. Electronically Signed   By: Monte Fantasia M.D.   On: 07/02/2019 04:55   MR BRAIN WO CONTRAST  Result Date: 07/02/2019 CLINICAL DATA:  Encephalopathy EXAM: MRI HEAD WITHOUT CONTRAST TECHNIQUE: Multiplanar, multiecho pulse sequences of the brain and surrounding structures were obtained without intravenous contrast. COMPARISON:  None. FINDINGS: Motion degraded with sagittal T1, axial T2, axial DWI, axial T2 FLAIR sequences attempted. Brain: There is no acute infarction. No mass effect. Prominence of the ventricles and sulci reflecting parenchymal volume loss. Vascular: Flow voids at the skull base where adequately visualized are preserved. Skull and upper cervical spine: No aggressive osseous lesion. Sinuses/Orbits: Aerated. Other: None. IMPRESSION: Motion degraded, partial study.  No acute infarction. Electronically Signed   By: Macy Mis M.D.   On: 07/02/2019 10:15   DG Chest Portable 1 View  Result Date: 07/01/2019 CLINICAL DATA:  Chest  pain EXAM: PORTABLE CHEST 1 VIEW COMPARISON:  Chest radiograph dated 07/04/2017 FINDINGS: The heart remains enlarged. Vascular calcifications are seen in the aortic arch. There are trace bilateral pleural effusions with associated atelectasis/airspace disease. There is no pneumothorax. The osseous structures appear intact. IMPRESSION: Cardiomegaly. Trace bilateral pleural effusions with associated atelectasis/airspace disease. Electronically Signed   By: Zerita Boers M.D.   On: 07/01/2019 17:36   ECHOCARDIOGRAM COMPLETE  Result Date: 07/02/2019    ECHOCARDIOGRAM REPORT   Patient Name:   Monica Lyons Date of Exam: 07/02/2019 Medical Rec #:  [id number]             Height:       67.0 in Accession #:    [id number]            Weight:       108.9 lb Date of Birth:  Aug 22, 1932             BSA:          1.562 m Patient Age:    72 years              BP:           151/108 mmHg Patient Gender: F                     HR:           110 bpm. Exam Location:  Inpatient Procedure: 2D Echo, Cardiac Doppler and Color Doppler Indications:    R07.9* Chest pain, unspecified  History:        Patient has prior history of Echocardiogram examinations, most                 recent 11/10/2017. Cardiomyopathy, Arrythmias:Atrial Fibrillation;                 Risk Factors:Hypertension. Cancer. CKD.  Sonographer:    Jonelle Sidle Dance Referring Phys: [place]  1. Left ventricular ejection fraction, by estimation, is 30 to 35%. The left ventricle has moderately decreased function. The left ventricle demonstrates global hypokinesis. There is mild left ventricular hypertrophy. Left ventricular diastolic parameters are indeterminate.  2. Right ventricular systolic function is moderately reduced. The right ventricular size is mildly enlarged. There is mildly elevated pulmonary artery systolic pressure. The estimated right ventricular systolic pressure is [id number] mmHg.  3. Left atrial size was severely dilated.  4. Right atrial  size was severely dilated.  5. The mitral valve is normal in structure. Moderate mitral valve regurgitation.  6. The tricuspid valve is abnormal. Tricuspid valve regurgitation is  moderate to severe.  7. The aortic valve is tricuspid. Aortic valve regurgitation is trivial. Mild aortic valve sclerosis is present, with no evidence of aortic valve stenosis.  8. The inferior vena cava is normal in size with <50% respiratory variability, suggesting right atrial pressure of 8 mmHg. FINDINGS  Left Ventricle: Left ventricular ejection fraction, by estimation, is 30 to 35%. The left ventricle has moderately decreased function. The left ventricle demonstrates global hypokinesis. The left ventricular internal cavity size was normal in size. There is mild left ventricular hypertrophy. Left ventricular diastolic parameters are indeterminate. Right Ventricle: The right ventricular size is mildly enlarged. Right vetricular wall thickness was not assessed. Right ventricular systolic function is moderately reduced. There is mildly elevated pulmonary artery systolic pressure. The tricuspid regurgitant velocity is 2.67 m/s, and with an assumed right atrial pressure of 8 mmHg, the estimated right ventricular systolic pressure is [id number] mmHg. Left Atrium: Left atrial size was severely dilated. Right Atrium: Right atrial size was severely dilated. Pericardium: Trivial pericardial effusion is present. Mitral Valve: The mitral valve is normal in structure. Moderate mitral valve regurgitation. Tricuspid Valve: The tricuspid valve is abnormal. Tricuspid valve regurgitation is moderate to severe. Aortic Valve: The aortic valve is tricuspid. Aortic valve regurgitation is trivial. Aortic regurgitation PHT measures 271 msec. Mild aortic valve sclerosis is present, with no evidence of aortic valve stenosis. Pulmonic Valve: The pulmonic valve was not well visualized. Pulmonic valve regurgitation is trivial. Aorta: The aortic root and ascending aorta  are structurally normal, with no evidence of dilitation. Venous: The inferior vena cava is normal in size with less than 50% respiratory variability, suggesting right atrial pressure of 8 mmHg. IAS/Shunts: No atrial level shunt detected by color flow Doppler. Additional Comments: There is pleural effusion in the left lateral region.  LEFT VENTRICLE PLAX 2D LVIDd:         4.70 cm LVIDs:         3.74 cm LV PW:         1.19 cm LV IVS:        0.77 cm LVOT diam:     1.80 cm LV SV:         18 LV SV Index:   12 LVOT Area:     2.54 cm  RIGHT VENTRICLE            IVC RV Basal diam:  2.88 cm    IVC diam: 2.12 cm RV S prime:     8.05 cm/s TAPSE (M-mode): 1.2 cm LEFT ATRIUM              Index       RIGHT ATRIUM           Index LA diam:        4.30 cm  2.75 cm/m  RA Area:     25.40 cm LA Vol (A2C):   152.0 ml 97.32 ml/m RA Volume:   84.20 ml  53.91 ml/m LA Vol (A4C):   86.3 ml  55.25 ml/m LA Biplane Vol: 115.0 ml 73.63 ml/m  AORTIC VALVE LVOT Vmax:   48.75 cm/s LVOT Vmean:  29.950 cm/s LVOT VTI:    0.071 m AI PHT:      271 msec  AORTA Ao Root diam: 3.30 cm Ao Asc diam:  3.20 cm MITRAL VALVE                TRICUSPID VALVE MV Area (PHT): 4.18 cm     TR Peak grad:  28.5 mmHg MV Decel Time: 182 msec     TR Vmax:        267.00 cm/s MV E velocity: 137.50 cm/s                             SHUNTS                             Systemic VTI:  0.07 m                             Systemic Diam: 1.80 cm Oswaldo Milian MD Electronically signed by Oswaldo Milian MD Signature Date/Time: 07/02/2019/8:48:06 PM    Final     Medications:   . apixaban  2.5 mg Oral Once  . apixaban  2.5 mg Oral BID  . budesonide (PULMICORT) nebulizer solution  0.25 mg Nebulization BID  . metoprolol tartrate  25 mg Oral BID  . sacubitril-valsartan  1 tablet Oral BID  . vitamin B-12  500 mcg Oral BID   Continuous Infusions: . doxycycline (VIBRAMYCIN) IV 100 mg (07/03/19 0819)     LOS: 0 days   Radene Gunning NP  [facility]   How to contact the [facility] Attending or Consulting provider [employer] or covering provider during after hours [place], for this patient?  1. Check the care team in [facility] and look for a) attending/consulting [facility] provider listed and b) the [facility] team listed 2. Log into [email or web address] and use [place]'s universal password to access. If you do not have the password, please contact the hospital operator. 3. Locate the [facility] provider you are looking for under [facility] and page to a number that you can be directly reached. 4. If you still have difficulty reaching the provider, please page the [facility] (Director on Call) for the Hospitalists listed on amion for assistance.  07/03/2019, 12:38 PM

## 2019-07-03 NOTE — TOC Initial Note (Signed)
Transition of Care [facility]) - Initial/Assessment Note    Patient Details  Name: Monica Lyons MRN: [id number] Date of Birth: [birth year]  [facility] CM/SW Contact:    Alberteen Sam, LCSW Phone Number: 07/03/2019, 9:42 AM  Clinical Narrative:                  CSW spoke with patient's daughter [name] and son Herbie [place] regarding SNF recommendation at time of discharge. They are in agreement to referrals being sent out for short term rehab, CSW will inform of bed offers once received. Preference [place] or [place] area SNFs.   Expected Discharge Plan: Skilled Nursing Facility Barriers to Discharge: Continued Medical Work up   Patient Goals and CMS Choice   CMS Medicare.gov Compare Post Acute Care list provided to:: Patient Represenative (must comment)(daughter [name]) Choice offered to / list presented to : Adult Children  Expected Discharge Plan and Services Expected Discharge Plan: [place] Acute Care Choice: [place] Living arrangements for the past 2 months: Single Family Home                                      Prior Living Arrangements/Services Living arrangements for the past 2 months: Single Family Home Lives with:: Self Patient language and need for interpreter reviewed:: Yes Do you feel safe going back to the place where you live?: No   needs short term rehab  Need for Family Participation in Patient Care: Yes (Comment) Care giver support system in place?: Yes (comment)   Criminal Activity/Legal Involvement Pertinent to Current Situation/Hospitalization: No - Comment as needed  Activities of Daily Living      Permission Sought/Granted Permission sought to share information with : Case Manager, Customer service manager, Family Supports Permission granted to share information with : Yes, Verbal Permission Granted  Share Information with NAME: [name]  Permission granted to share info w AGENCY:  SNFs  Permission granted to share info w Relationship: daughter  Permission granted to share info w Contact Information: [phone]  Emotional Assessment Appearance:: Appears stated age     Orientation: : Oriented to Self Alcohol / Substance Use: Not Applicable Psych Involvement: No (comment)  Admission diagnosis:  Chest pain [R07.9] Patient Active Problem List   Diagnosis Date Noted  . Chronic dementia ([place]) 07/02/2019  . Bilateral pleural effusion 07/02/2019  . Abnormal chest x-ray 07/02/2019  . NICM (nonischemic cardiomyopathy) ([place])   . A-fib ([place])   . CKD (chronic kidney disease), stage II   . Chest pain 07/01/2019  . Atrial fibrillation with RVR ([place]) 07/01/2019  . Coronary artery disease 08/02/2017  . Severe left ventricular systolic dysfunction [id number]  . Paroxysmal atrial fibrillation ([place]) 06/21/2017  . Acute combined systolic and diastolic heart failure ([place]) 06/21/2017  . Moderate pulmonary arterial systolic hypertension ([place]) 06/21/2017  . Cancer ([place])   . Asthma   . Osteopenia   . Hypertension    PCP:  Secundino Ginger, PA-C Pharmacy:   [facility] [place] [facility] [place], [place] [place] [address] Phone: [phone] Fax: [phone]     Social Determinants of Health (SDOH) Interventions    Readmission Risk Interventions No flowsheet data found.

## 2019-07-03 NOTE — Evaluation (Signed)
Occupational Therapy Evaluation Patient Details Name: Monica Lyons MRN: [id number] DOB: Aug 05, 1932 Today's Date: 07/03/2019    History of Present Illness Pt is an 86 y.o. female admitted with AMS and c/o chest pain, pt found wandering by a neighbor. Head CT negative for acute abnormality. No sign of infection. Pt found to have afib with RVR. PMH includes dementia, HTN, CKD, cancer, asthma, NICM, osteopenia.   Clinical Impression   Patient is unreliable historian, oriented to self only. Patient has history of dementia, however per chart son reports has increased confusion past few weeks. When at sink to brush teeth patient states "who am I dedicating this brushing to?" Patient making multiple non-sensical statements throughout session. Patient require min A for functional transfers and mobility due to high fall risk with multiple posterior lean at sink with encouragement to place hand on sink for steadying assist. Will continue to follow to maximize patient safety with self care.    Follow Up Recommendations  SNF;Supervision/Assistance - 24 hour    Equipment Recommendations  Other (comment)(defer to next venue)       Precautions / Restrictions Precautions Precautions: Fall Restrictions Weight Bearing Restrictions: No      Mobility Bed Mobility               General bed mobility comments: Received sitting in recliner  Transfers Overall transfer level: Needs assistance Equipment used: None Transfers: Sit to/from Stand Sit to Stand: Min assist         General transfer comment: min A for safety/ balance    Balance Overall balance assessment: Needs assistance   Sitting balance-Leahy Scale: Fair     Standing balance support: Single extremity supported;No upper extremity supported Standing balance-Leahy Scale: Poor Standing balance comment: patient with multiple posterior lean standing at sink, encourage patient to use L UE to support while brushing her teeth  and min A for safety                           ADL either performed or assessed with clinical judgement   ADL Overall ADL's : Needs assistance/impaired Eating/Feeding: Set up;Sitting   Grooming: Minimal assistance;Standing;Oral care;Wash/dry face;Wash/dry hands Grooming Details (indicate cue type and reason): min A for safety with balance due to patient having x3 posterior lean Upper Body Bathing: Supervision/ safety;Sitting   Lower Body Bathing: Minimal assistance;Sit to/from stand;Sitting/lateral leans   Upper Body Dressing : Supervision/safety;Sitting   Lower Body Dressing: Minimal assistance;Sitting/lateral leans;Sit to/from stand   Toilet Transfer: Minimal assistance;Ambulation;Cueing for safety Toilet Transfer Details (indicate cue type and reason): simulated with functional mobility, min A for safety with balance Toileting- Clothing Manipulation and Hygiene: Minimal assistance;Moderate assistance;Sitting/lateral lean;Sit to/from stand       Functional mobility during ADLs: Minimal assistance General ADL Comments: patient requires increased assistance for self care due to decreased cognition/safety awareness, decreased balance                  Pertinent Vitals/Pain Pain Assessment: Faces Faces Pain Scale: No hurt     Hand Dominance Right   Extremity/Trunk Assessment Upper Extremity Assessment Upper Extremity Assessment: Generalized weakness   Lower Extremity Assessment Lower Extremity Assessment: Defer to PT evaluation   Cervical / Trunk Assessment Cervical / Trunk Assessment: Kyphotic   Communication Communication Communication: Expressive difficulties;HOH;Other (comment)(mumbled speech)   Cognition Arousal/Alertness: Awake/alert Behavior During Therapy: WFL for tasks assessed/performed Overall Cognitive Status: History of cognitive impairments - at baseline  General Comments: history of dementia,  oriented to self only. per chart son reports worsening dementia over past few weeks              [place] expects to be discharged to:: Skilled nursing facility(vs ALF/memory care) Living Arrangements: Alone Available Help at Discharge: Family;Available PRN/intermittently;Friend(s)                             Additional Comments: patient is unreliable historian      Prior Functioning/Environment Level of Independence: Independent        Comments: per chart review patient was still driving and managing own medications        OT Problem List: Decreased activity tolerance;Impaired balance (sitting and/or standing);Decreased cognition;Decreased safety awareness      OT Treatment/Interventions: Self-care/ADL training;Therapeutic exercise;DME and/or AE instruction;Therapeutic activities;Cognitive remediation/compensation;Patient/family education;Balance training    OT Goals(Current goals can be found in the care plan section) Acute Rehab OT Goals Patient Stated Goal: "I need my phone, my son might call" OT Goal Formulation: With patient Time For Goal Achievement: 07/17/19 Potential to Achieve Goals: Fair  OT Frequency: Min 2X/week    AM-PAC OT "6 Clicks" Daily Activity     Outcome Measure Help from another person eating meals?: A Little Help from another person taking care of personal grooming?: A Little Help from another person toileting, which includes using toliet, bedpan, or urinal?: A Little Help from another person bathing (including washing, rinsing, drying)?: A Little Help from another person to put on and taking off regular upper body clothing?: A Little Help from another person to put on and taking off regular lower body clothing?: A Little 6 Click Score: 18   End of Session  Activity Tolerance: Patient tolerated treatment well Patient left: in chair;with call bell/phone within reach;with chair alarm set  OT Visit Diagnosis:  Unsteadiness on feet (R26.81);Other abnormalities of gait and mobility (R26.89);Muscle weakness (generalized) (M62.81);Other symptoms and signs involving cognitive function                Time: [id number] OT Time Calculation (min): 17 min Charges:  OT General Charges $OT Visit: 1 Visit OT Evaluation $OT Eval Low Complexity: [place] OT OT office: [place] 07/03/2019, 12:26 PM

## 2019-07-03 NOTE — NC FL2 (Signed)
[place] LEVEL OF CARE SCREENING TOOL     IDENTIFICATION  Patient Name: Monica Lyons Birthdate: 1933-02-07 Sex: female Admission Date (Current Location): 07/01/2019  [facility] and [place] Number:  Herbalist and Address:  [facility], [place] [address], [place], [place] [address]      Provider Number: [id number]  Attending Physician Name and Address:  Geradine Girt, DO  Relative Name and Phone Number:  Daughter [name]    Current Level of Care: Hospital Recommended Level of Care: [place] Prior Approval Number:    Date Approved/Denied: 07/03/19 PASRR Number: [id number]  Discharge Plan: SNF    Current Diagnoses: Patient Active Problem List   Diagnosis Date Noted  . Chronic dementia ([place]) 07/02/2019  . Bilateral pleural effusion 07/02/2019  . Abnormal chest x-ray 07/02/2019  . NICM (nonischemic cardiomyopathy) ([place])   . A-fib ([place])   . CKD (chronic kidney disease), stage II   . Chest pain 07/01/2019  . Atrial fibrillation with RVR ([place]) 07/01/2019  . Coronary artery disease 08/02/2017  . Severe left ventricular systolic dysfunction [id number]  . Paroxysmal atrial fibrillation ([place]) 06/21/2017  . Acute combined systolic and diastolic heart failure ([place]) 06/21/2017  . Moderate pulmonary arterial systolic hypertension ([place]) 06/21/2017  . Cancer ([place])   . Asthma   . Osteopenia   . Hypertension     Orientation RESPIRATION BLADDER Height & Weight     Self  Normal Continent Weight: 112 lb 3.2 oz (50.9 kg) Height:  5\' 7"  (170.2 cm)  BEHAVIORAL SYMPTOMS/MOOD NEUROLOGICAL BOWEL NUTRITION STATUS      Continent Diet(See Discharge Summary)  AMBULATORY STATUS COMMUNICATION OF NEEDS Skin   Limited Assist Verbally Normal                       Personal Care Assistance Level of Assistance  Bathing, Feeding, Dressing Bathing Assistance: Limited assistance Feeding assistance: Independent(able to feed  self) Dressing Assistance: Limited assistance     Functional Limitations Info  Sight, Hearing, Speech Sight Info: Adequate Hearing Info: Adequate Speech Info: Adequate    SPECIAL CARE FACTORS FREQUENCY  PT (By licensed PT), OT (By licensed OT)     PT Frequency: 5x Min Weekly OT Frequency: 5x Min Weekly            Contractures Contractures Info: Not present    Additional Factors Info  Code Status, Allergies Code Status Info: FULL Allergies Info: Aspirin,Bee Venom,Erythromycin, Walnut,Nutmeg Oil (myristica Oil),Zoster Vaccine Live,Amlodipine Besylate,Chlorthalidone,Diltiazem Hcl,Morphine And Related,Sulfa Antibiotics,Symbicort budesonide-formoterol Fumarate,Talc,Tape           Current Medications (07/03/2019):  This is the current hospital active medication list Current Facility-Administered Medications  Medication Dose Route Frequency Provider Last Rate Last Admin  . acetaminophen (TYLENOL) tablet 650 mg  650 mg Oral Q6H PRN Rise Patience, MD   650 mg at 07/02/19 2040   Or  . acetaminophen (TYLENOL) suppository 650 mg  650 mg Rectal Q6H PRN Rise Patience, MD      . apixaban Arne [place]) tablet 2.5 mg  2.5 mg Oral Once Rise Patience, MD      . apixaban Arne [place]) tablet 2.5 mg  2.5 mg Oral BID Rise Patience, MD   2.5 mg at 07/03/19 [id number]  . budesonide (PULMICORT) nebulizer solution 0.25 mg  0.25 mg Nebulization BID Eulogio Bear U, DO   0.25 mg at 07/03/19 0916  . doxycycline (VIBRAMYCIN) 100 mg in sodium chloride  0.9 % 250 mL IVPB  100 mg Intravenous Q12H Rise Patience, MD 125 mL/hr at 07/03/19 0819 100 mg at 07/03/19 0819  . losartan (COZAAR) tablet 50 mg  50 mg Oral Daily Rise Patience, MD   50 mg at 07/03/19 0813  . metoprolol tartrate (LOPRESSOR) tablet 25 mg  25 mg Oral BID Rise Patience, MD   25 mg at 07/03/19 0813  . ondansetron (ZOFRAN) tablet 4 mg  4 mg Oral Q6H PRN Rise Patience, MD       Or  . ondansetron  [facility]) injection 4 mg  4 mg Intravenous Q6H PRN Rise Patience, MD      . vitamin B-12 (CYANOCOBALAMIN) tablet 500 mcg  500 mcg Oral BID Rise Patience, MD   500 mcg at 07/03/19 [id number]     Discharge Medications: Please see discharge summary for a list of discharge medications.  Relevant Imaging Results:  Relevant Lab Results:   Additional Information SSN- [id number]  Trula Ore, LCSWA

## 2019-07-03 NOTE — Evaluation (Signed)
Physical Therapy Evaluation Patient Details Name: Monica Lyons MRN: [id number] DOB: May 06, 1932 Today's Date: 07/03/2019   History of Present Illness  Pt is an 86 y.o. female admitted with AMS and c/o chest pain, pt found wandering by a neighbor. Head CT negative for acute abnormality. No sign of infection. Pt found to have afib with RVR. PMH includes dementia, HTN, CKD, cancer, asthma, NICM, osteopenia.    Clinical Impression  Pt presents with an overall decrease in functional mobility secondary to above. Pt pleasantly confused (only oriented to self) and unreliable historian; per chart, from home alone and moving independently; increased safety concerns due to worsening cognition. Today, pt required up to minA for mobility, poor safety awareness; at high risk for falls. Pt would benefit from continued acute PT services to maximize functional mobility and independence prior to d/c with SNF-level therapies.     Follow Up Recommendations SNF;Supervision/Assistance - 24 hour    Equipment Recommendations  (defer)    Recommendations for Other Services       Precautions / Restrictions Precautions Precautions: Fall Restrictions Weight Bearing Restrictions: No      Mobility  Bed Mobility               General bed mobility comments: Received sitting in recliner  Transfers Overall transfer level: Needs assistance Equipment used: 1 person hand held assist;None Transfers: Sit to/from Stand Sit to Stand: Min assist;Supervision         General transfer comment: MinA for HHA to stand from recliner; supervision standing from low toilet with grab bar  Ambulation/Gait Ambulation/Gait assistance: Min guard Gait Distance (Feet): 50 Feet Assistive device: 1 person hand held assist;None Gait Pattern/deviations: Step-through pattern;Decreased stride length;Trunk flexed   Gait velocity interpretation: 1.31 - 2.62 ft/sec, indicative of limited community ambulator General Gait  Details: Ambulated throughout room with intermittent UE support and min guard for safety; frequent cues to stay on task. Poor safety awareness  Stairs            Wheelchair Mobility    Modified Rankin (Stroke Patients Only)       Balance Overall balance assessment: Needs assistance   Sitting balance-Leahy Scale: Fair       Standing balance-Leahy Scale: Fair                               Pertinent Vitals/Pain Pain Assessment: No/denies pain    Home Living Family/patient expects to be discharged to:: Skilled nursing facility(SNF vs. ALF/memory care) Living Arrangements: Alone Available Help at Discharge: Family;Available PRN/intermittently;Friend(s)                  Prior Function Level of Independence: Independent         Comments: Moving independently. Per chart review, sounds like increased difficulty with safety at home due to worsening dementia     Hand Dominance        Extremity/Trunk Assessment   Upper Extremity Assessment Upper Extremity Assessment: Generalized weakness    Lower Extremity Assessment Lower Extremity Assessment: Generalized weakness    Cervical / Trunk Assessment Cervical / Trunk Assessment: Kyphotic  Communication   Communication: Expressive difficulties;HOH(soft, mumbled speech)  Cognition Arousal/Alertness: Awake/alert Behavior During Therapy: WFL for tasks assessed/performed Overall Cognitive Status: History of cognitive impairments - at baseline  General Comments: H/o dementia; son reports this worsening over past few weeks. Oriented to self, able to state she is not home but unable to answer other orientation questions. Could tell RN she can't have aspirin. Following majority of simple verbal and gestural commands appropriately. Pleasantly confused      General Comments      Exercises     Assessment/Plan    PT Assessment Patient needs continued PT  services  PT Problem List Decreased strength;Decreased activity tolerance;Decreased balance;Decreased mobility;Decreased cognition;Decreased knowledge of use of DME;Decreased safety awareness       PT Treatment Interventions DME instruction;Gait training;Stair training;Functional mobility training;Therapeutic activities;Therapeutic exercise;Balance training;Cognitive remediation;Patient/family education    PT Goals (Current goals can be found in the Care Plan section)  Acute Rehab PT Goals Patient Stated Goal: None stated PT Goal Formulation: With patient Time For Goal Achievement: 07/17/19    Frequency Min 2X/week   Barriers to discharge Decreased caregiver support      Co-evaluation               AM-PAC PT "6 Clicks" Mobility  Outcome Measure Help needed turning from your back to your side while in a flat bed without using bedrails?: A Little Help needed moving from lying on your back to sitting on the side of a flat bed without using bedrails?: A Little Help needed moving to and from a bed to a chair (including a wheelchair)?: A Little Help needed standing up from a chair using your arms (e.g., wheelchair or bedside chair)?: A Little Help needed to walk in hospital room?: A Little Help needed climbing 3-5 steps with a railing? : A Little 6 Click Score: 18    End of Session   Activity Tolerance: Patient tolerated treatment well Patient left: in chair;with call bell/phone within reach;with nursing/sitter in room;with chair alarm set Nurse Communication: Mobility status;Other (comment)(Recommend posey chair alarm belt, Retail banker to order (RN aware)) PT Visit Diagnosis: Other abnormalities of gait and mobility (R26.89);Muscle weakness (generalized) (M62.81)    Time: [id number] PT Time Calculation (min) (ACUTE ONLY): 18 min   Charges:   PT Evaluation $PT Eval Moderate Complexity: [place], PT, DPT Acute Rehabilitation Services  Pager  [phone] Office [place] 07/03/2019, 9:05 AM

## 2019-07-04 DIAGNOSIS — J45909 Unspecified asthma, uncomplicated: Secondary | ICD-10-CM | POA: Diagnosis present

## 2019-07-04 DIAGNOSIS — R9389 Abnormal findings on diagnostic imaging of other specified body structures: Secondary | ICD-10-CM | POA: Diagnosis not present

## 2019-07-04 DIAGNOSIS — F0391 Unspecified dementia with behavioral disturbance: Secondary | ICD-10-CM | POA: Diagnosis not present

## 2019-07-04 DIAGNOSIS — I480 Paroxysmal atrial fibrillation: Principal | ICD-10-CM

## 2019-07-04 DIAGNOSIS — Z882 Allergy status to sulfonamides status: Secondary | ICD-10-CM | POA: Diagnosis not present

## 2019-07-04 DIAGNOSIS — I5023 Acute on chronic systolic (congestive) heart failure: Secondary | ICD-10-CM | POA: Diagnosis present

## 2019-07-04 DIAGNOSIS — Z9221 Personal history of antineoplastic chemotherapy: Secondary | ICD-10-CM | POA: Diagnosis not present

## 2019-07-04 DIAGNOSIS — Z91030 Bee allergy status: Secondary | ICD-10-CM | POA: Diagnosis not present

## 2019-07-04 DIAGNOSIS — Z91048 Other nonmedicinal substance allergy status: Secondary | ICD-10-CM | POA: Diagnosis not present

## 2019-07-04 DIAGNOSIS — Z886 Allergy status to analgesic agent status: Secondary | ICD-10-CM | POA: Diagnosis not present

## 2019-07-04 DIAGNOSIS — Z887 Allergy status to serum and vaccine status: Secondary | ICD-10-CM | POA: Diagnosis not present

## 2019-07-04 DIAGNOSIS — I2510 Atherosclerotic heart disease of native coronary artery without angina pectoris: Secondary | ICD-10-CM

## 2019-07-04 DIAGNOSIS — F0390 Unspecified dementia without behavioral disturbance: Secondary | ICD-10-CM | POA: Diagnosis present

## 2019-07-04 DIAGNOSIS — J90 Pleural effusion, not elsewhere classified: Secondary | ICD-10-CM

## 2019-07-04 DIAGNOSIS — N182 Chronic kidney disease, stage 2 (mild): Secondary | ICD-10-CM | POA: Diagnosis not present

## 2019-07-04 DIAGNOSIS — Z8261 Family history of arthritis: Secondary | ICD-10-CM | POA: Diagnosis not present

## 2019-07-04 DIAGNOSIS — Z885 Allergy status to narcotic agent status: Secondary | ICD-10-CM | POA: Diagnosis not present

## 2019-07-04 DIAGNOSIS — Z809 Family history of malignant neoplasm, unspecified: Secondary | ICD-10-CM | POA: Diagnosis not present

## 2019-07-04 DIAGNOSIS — Z853 Personal history of malignant neoplasm of breast: Secondary | ICD-10-CM | POA: Diagnosis not present

## 2019-07-04 DIAGNOSIS — I2721 Secondary pulmonary arterial hypertension: Secondary | ICD-10-CM

## 2019-07-04 DIAGNOSIS — Z833 Family history of diabetes mellitus: Secondary | ICD-10-CM | POA: Diagnosis not present

## 2019-07-04 DIAGNOSIS — Z9109 Other allergy status, other than to drugs and biological substances: Secondary | ICD-10-CM | POA: Diagnosis not present

## 2019-07-04 DIAGNOSIS — Z91018 Allergy to other foods: Secondary | ICD-10-CM | POA: Diagnosis not present

## 2019-07-04 DIAGNOSIS — N1832 Chronic kidney disease, stage 3b: Secondary | ICD-10-CM | POA: Diagnosis present

## 2019-07-04 DIAGNOSIS — Z9114 Patient's other noncompliance with medication regimen: Secondary | ICD-10-CM | POA: Diagnosis not present

## 2019-07-04 DIAGNOSIS — I428 Other cardiomyopathies: Secondary | ICD-10-CM

## 2019-07-04 DIAGNOSIS — I4891 Unspecified atrial fibrillation: Secondary | ICD-10-CM | POA: Diagnosis present

## 2019-07-04 DIAGNOSIS — Z825 Family history of asthma and other chronic lower respiratory diseases: Secondary | ICD-10-CM | POA: Diagnosis not present

## 2019-07-04 DIAGNOSIS — M8580 Other specified disorders of bone density and structure, unspecified site: Secondary | ICD-10-CM | POA: Diagnosis present

## 2019-07-04 DIAGNOSIS — I130 Hypertensive heart and chronic kidney disease with heart failure and stage 1 through stage 4 chronic kidney disease, or unspecified chronic kidney disease: Secondary | ICD-10-CM | POA: Diagnosis present

## 2019-07-04 DIAGNOSIS — Z20822 Contact with and (suspected) exposure to covid-19: Secondary | ICD-10-CM | POA: Diagnosis present

## 2019-07-04 DIAGNOSIS — I255 Ischemic cardiomyopathy: Secondary | ICD-10-CM | POA: Diagnosis present

## 2019-07-04 LAB — BASIC METABOLIC PANEL
Anion gap: 11 (ref 5–15)
BUN: 36 mg/dL — ABNORMAL HIGH (ref 8–23)
CO2: 19 mmol/L — ABNORMAL LOW (ref 22–32)
Calcium: 8.7 mg/dL — ABNORMAL LOW (ref 8.9–10.3)
Chloride: 105 mmol/L (ref 98–111)
Creatinine, Ser: 1.34 mg/dL — ABNORMAL HIGH (ref 0.44–1.00)
GFR calc Af Amer: 42 mL/min — ABNORMAL LOW (ref >60)
GFR calc non Af Amer: 36 mL/min — ABNORMAL LOW (ref >60)
Glucose, Bld: 115 mg/dL — ABNORMAL HIGH (ref 70–99)
Potassium: 3.5 mmol/L (ref 3.5–5.1)
Sodium: 135 mmol/L (ref 135–145)

## 2019-07-04 LAB — CBC
HCT: 44.2 % (ref 36.0–46.0)
Hemoglobin: 14.5 g/dL (ref 12.0–15.0)
MCH: 29.1 pg (ref 26.0–34.0)
MCHC: 32.8 g/dL (ref 30.0–36.0)
MCV: 88.6 fL (ref 80.0–100.0)
Platelets: 229 10*3/uL (ref 150–400)
RBC: 4.99 MIL/uL (ref 3.87–5.11)
RDW: 14.3 % (ref 11.5–15.5)
WBC: 10.6 10*3/uL — ABNORMAL HIGH (ref 4.0–10.5)
nRBC: 0 % (ref 0.0–0.2)

## 2019-07-04 LAB — SARS CORONAVIRUS 2 (TAT 6-24 HRS): SARS Coronavirus 2: NEGATIVE

## 2019-07-04 MED ORDER — LORATADINE 10 MG PO TABS
10.0000 mg | ORAL_TABLET | Freq: Every day | ORAL | Status: DC
  Administered 2019-07-04 – 2019-07-06 (×3): 10 mg via ORAL
  Filled 2019-07-04 (×3): qty 1

## 2019-07-04 NOTE — TOC Progression Note (Signed)
Transition of Care [facility]) - Progression Note    Patient Details  Name: Monica Lyons MRN: [id number] Date of Birth: 1933-01-17  Transition of Care [facility]) CM/SW [place], [place] Phone Number: 07/04/2019, 5:04 PM  Clinical Narrative:     CSW spoke with patient's daughter Monica Lyons who reports she is agreeable to stay with patient some tonight to comfort patient, she was updated by CSW That we continue to await for insurance auth for SNF , hopeful of auth tomorrow for patient to discharge to [facility] in [place].   RN updated.   Expected Discharge Plan: [place] Barriers to Discharge: Continued Medical Work up  Expected Discharge Plan and Services Expected Discharge Plan: [place] Choice: [place] arrangements for the past 2 months: Single Family Home                                       Social Determinants of Health (SDOH) Interventions    Readmission Risk Interventions No flowsheet data found.

## 2019-07-04 NOTE — Progress Notes (Signed)
Physical Therapy Treatment Patient Details Name: Monica Lyons MRN: [id number] DOB: 12/30/1932 Today's Date: 07/04/2019    History of Present Illness Pt is an 86 y.o. female admitted with AMS and c/o chest pain, pt found wandering by a neighbor. Head CT negative for acute abnormality. No sign of infection. Pt found to have afib with RVR. PMH includes dementia, HTN, CKD, cancer, asthma, NICM, osteopenia.    PT Comments    Patient seen for PT treatment. Pt in chair upon arrival and agreeable to participate. Pt tolerated mobility well and requires min guard/min A. Pt with tangential thoughts and at times with unintelligible speech. Pt continues to present with balance and cognitive deficits increasing risk for falls.  Continue to progress as tolerated with anticipated d/c to SNF for further skilled PT services.     Follow Up Recommendations  SNF;Supervision/Assistance - 24 hour     Equipment Recommendations  Other (comment)(defer)    Recommendations for Other Services       Precautions / Restrictions Precautions Precautions: Fall Restrictions Weight Bearing Restrictions: No    Mobility  Bed Mobility               General bed mobility comments: Received sitting in recliner  Transfers Overall transfer level: Needs assistance Equipment used: None Transfers: Sit to/from Stand Sit to Stand: Min guard         General transfer comment: min guard for safety  Ambulation/Gait Ambulation/Gait assistance: Min guard;Min assist Gait Distance (Feet): 250 Feet(with several stops in the hall as pt is easily distracted) Assistive device: None Gait Pattern/deviations: Step-through pattern;Decreased stride length;Trunk flexed Gait velocity: decreased   General Gait Details: assist at times to steady using gait belt; pt unable to navigate hallway without mod cues and max cues to stay focused on task   Stairs             Wheelchair Mobility    Modified Rankin  (Stroke Patients Only)       Balance Overall balance assessment: Needs assistance   Sitting balance-Leahy Scale: Good     Standing balance support: No upper extremity supported Standing balance-Leahy Scale: Fair                              Cognition Arousal/Alertness: Awake/alert Behavior During Therapy: WFL for tasks assessed/performed Overall Cognitive Status: History of cognitive impairments - at baseline                                 General Comments: history of dementia, oriented to self only. per chart son reports worsening dementia over past few weeks      Exercises      General Comments        Pertinent Vitals/Pain Pain Assessment: Faces Faces Pain Scale: No hurt    Home Living                      Prior Function            PT Goals (current goals can now be found in the care plan section) Progress towards PT goals: Progressing toward goals    Frequency    Min 2X/week      PT Plan Current plan remains appropriate    Co-evaluation              AM-PAC PT "6 Clicks" Mobility  Outcome Measure  Help needed turning from your back to your side while in a flat bed without using bedrails?: A Little Help needed moving from lying on your back to sitting on the side of a flat bed without using bedrails?: A Little Help needed moving to and from a bed to a chair (including a wheelchair)?: A Little Help needed standing up from a chair using your arms (e.g., wheelchair or bedside chair)?: A Little Help needed to walk in hospital room?: A Little Help needed climbing 3-5 steps with a railing? : A Little 6 Click Score: 18    End of Session Equipment Utilized During Treatment: Gait belt Activity Tolerance: Patient tolerated treatment well Patient left: in chair;with call bell/phone within reach;with chair alarm set Nurse Communication: Mobility status PT Visit Diagnosis: Other abnormalities of gait and mobility  (R26.89);Muscle weakness (generalized) (M62.81)     Time: [id number] PT Time Calculation (min) (ACUTE ONLY): 20 min  Charges:  $Gait Training: 8-22 mins                     Earney Navy, PTA Acute Rehabilitation Services Pager: [phone] Office: [phone]     Darliss Cheney 07/04/2019, 4:38 PM

## 2019-07-04 NOTE — Progress Notes (Addendum)
PROGRESS NOTE  Monica Lyons [id number] DOB: 1933-01-15 DOA: 07/01/2019 PCP: Secundino Ginger, PA-C   LOS: 0 days   Brief narrative: As per HPI,  Monica Lyons is an 86 y.o. female with  past medical history of nonischemic cardiomyopathy, atrial fibrillation, CAD, dementia presented to the emergency department March 29 th wih chief complaint of chest pain and increasing confusion. Reportedly patient noted by a neighbor wandering the neighborhood and complained of chest pain.  Son reported worsening dementia over several weeks prior to presentation. Work-up included a chest x-ray questionable for infiltrate no metabolic derangement. CT of the head was negative.  Son and daughter subsequently stated that for last several months patient's dementia had worsened to the point she was noncompliant with medications, not participating in personal hygiene or domestic duties.  Assessment/Plan:  Principal Problem:   Chronic dementia ([place]) Active Problems:   Hypertension   Paroxysmal atrial fibrillation (HCC)   Moderate pulmonary arterial systolic hypertension (HCC)   Coronary artery disease   Chest pain   Atrial fibrillation with RVR (HCC)   Bilateral pleural effusion   NICM (nonischemic cardiomyopathy) (HCC)   Abnormal chest x-ray   CKD (chronic kidney disease), stage II   Progressive dementia, likely advanced.  At this time, patient is unable to recognize family members and was wandering at night.  She lives alone.CT of the head without acute abnormalities. MRI of the brain without acute abnormalities.  Vitamin B12 was elevated, ammonia level within the limits of normal.  Procalcitonin within the limits of normal.Chest x-ray questionable for infiltrate so antibiotics were initiated.   Pro Cal was however negative.  Urinalysis with protein and rare bacteria otherwise within the limits of normal.  Patient did not have fever and remained without leukocytosis.  So the antibiotic  was subsequently discontinued.  Continue incentive spirometry.  Chest pain.   History of nonischemic cardiomyopathy, A. fib and coronary artery disease..  Cardiology visit in November 2020.  No complains of chest pain today.Poor historian.  Mildly elevated troponin but flat.  Will continue oninclude  Entresto and Eliquis, metoprolol..    Previous 2D echocardiogram showed ejection of 60% after cardioversion..  Echo's admission reveals an ejection fraction of 30 to 35%, left ventricle moderately decreased function, left ventricle demonstrates global hypokinesis, mild left ventricular hypertrophy.  Continue intake and output charting.  Atrial fibrillation with RVR. On presentation, likelyrelated to noncompliance with medications. Received IV metoprolol in the ED.  Continue metoprolol twice a day, Eliquis.  Continue Entresto.  She received IV metoprolol in the emergency department. Home medications include no controlling drugs. Metoprolol p.o. twice daily was started.  Continues with heart rate range in the 120s.  Blood pressure stable so far.  Essentialhypertension. Continue metoprolol, Entresto.  Stop losartan from home.  Blood pressure seems to be stable at this time.    Chronic kidney disease stage II. Creatinine is 1.4 on presentation.  Creatinine of 1.3 today.  Will monitor BMP.  Watch closely on Entresto.  VTE Prophylaxis: Eliquis  Code Status: Full code  Family Communication: None today  Disposition Plan:   . Patient is from home . Likely disposition to skilled nursing facility.  Physical therapy has seen the patient and recommend skilled nursing facility placement. . Barriers to discharge: Waiting for skilled nursing facility placement, unsafe for disposition home due to advanced dementia, wandering status, patient lives by herself at home.. We will change the patient's status to inpatient at this time.   Consultants:  None  Procedures:  None  Antibiotics:   Doxy  3/29-/3/30  Anti-infectives (From admission, onward)   Start     Dose/Rate Route Frequency Ordered Stop   07/02/19 0800  doxycycline (VIBRAMYCIN) 100 mg in sodium chloride 0.9 % 250 mL IVPB  Status:  Discontinued     100 mg 125 mL/hr over 120 Minutes Intravenous Every 12 hours 07/02/19 0714 07/03/19 1248     Subjective: Today, patient was seen and examined at bedside.  Patient complains of mild sinus area pain.  Objective: Vitals:   07/03/19 2036 07/04/19 0406  BP: 130/87 117/83  Pulse: (!) 107 (!) 103  Resp: 20 18  Temp:  97.8 F (36.6 C)  SpO2: 95% 100%    Intake/Output Summary (Last 24 hours) at 07/04/2019 1332 Last data filed at 07/04/2019 1253 Gross per 24 hour  Intake 1240 ml  Output --  Net 1240 ml   Filed Weights   07/01/19 1617 07/03/19 0338 07/04/19 0400  Weight: 49.4 kg 50.9 kg 53.5 kg   Body mass index is 18.47 kg/m.   Physical Exam:  GENERAL: Patient is alert awake and communicative, disoriented. Not in obvious distress.  Underlying dementia.  Thinly built, frail appearing. HENT: No scleral pallor or icterus. Pupils equally reactive to light. Oral mucosa is moist.  No sinus area tenderness NECK: is supple, no gross swelling noted. CHEST: Clear to auscultation. No crackles or wheezes.  Diminished breath sounds bilaterally. CVS: S1 and S2 heard, no murmur.  Irregular rhythm. ABDOMEN: Soft, non-tender, bowel sounds are present. EXTREMITIES: No edema. CNS: Cranial nerves are intact. No focal motor deficits.  Oriented to self. SKIN: warm and dry without rashes.  Data Review: I have personally reviewed the following laboratory data and studies,  CBC: Recent Labs  Lab 07/01/19 1607 07/03/19 0501 07/04/19 0454  WBC 7.5 9.6 10.6*  NEUTROABS 5.5  --   --   HGB 13.4 14.9 14.5  HCT 41.2 45.5 44.2  MCV 89.6 90.5 88.6  PLT 209 218 [id number]   Basic Metabolic Panel: Recent Labs  Lab 07/01/19 1607 07/02/19 0849 07/03/19 0501 07/04/19 0454  NA 138 138 139  135  K 3.8 4.7 4.2 3.5  CL 105 105 107 105  CO2 22 18* 19* 19*  GLUCOSE 112* 130* 120* 115*  BUN 28* 31* 37* 36*  CREATININE 1.40* 1.47* 1.63* 1.34*  CALCIUM 9.3 9.4 9.1 8.7*  MG 1.9  --   --   --    Liver Function Tests: Recent Labs  Lab 07/02/19 0849  AST 28  ALT 17  ALKPHOS 61  BILITOT 1.7*  PROT 6.2*  ALBUMIN 3.5   No results for input(s): LIPASE, AMYLASE in the last 168 hours. Recent Labs  Lab 07/02/19 0712  AMMONIA 30   Cardiac Enzymes: No results for input(s): CKTOTAL, CKMB, CKMBINDEX, TROPONINI in the last 168 hours. BNP (last 3 results) Recent Labs    07/02/19 0932  BNP 1,428.9*    ProBNP (last 3 results) No results for input(s): PROBNP in the last 8760 hours.  CBG: Recent Labs  Lab 07/01/19 1605  GLUCAP 107*   Recent Results (from the past 240 hour(s))  SARS CORONAVIRUS 2 (TAT 6-24 HRS) Nasopharyngeal Nasopharyngeal Swab     Status: None   Collection Time: 07/03/19  6:00 AM   Specimen: Nasopharyngeal Swab  Result Value Ref Range Status   SARS Coronavirus 2 NEGATIVE NEGATIVE Final    Comment: (NOTE) SARS-CoV-2 target nucleic acids are NOT DETECTED. The SARS-CoV-2 RNA  is generally detectable in upper and lower respiratory specimens during the acute phase of infection. Negative results do not preclude SARS-CoV-2 infection, do not rule out co-infections with other pathogens, and should not be used as the sole basis for treatment or other patient management decisions. Negative results must be combined with clinical observations, patient history, and epidemiological information. The expected result is Negative. Fact Sheet for Patients: SugarRoll.be Fact Sheet for Healthcare Providers: https://www.woods-mathews.com/ This test is not yet approved or cleared by the [place] FDA and  has been authorized for detection and/or diagnosis of SARS-CoV-2 by FDA under an Emergency Use Authorization (EUA). This EUA  will remain  in effect (meaning this test can be used) for the duration of the COVID-19 declaration under Section 56 4(b)(1) of the Act, 21 U.S.C. section 360bbb-3(b)(1), unless the authorization is terminated or revoked sooner. Performed at [facility], [place] [address], [place], [place] [address]   SARS CORONAVIRUS 2 (TAT 6-24 HRS) Nasopharyngeal Nasopharyngeal Swab     Status: None   Collection Time: 07/04/19  5:58 AM   Specimen: Nasopharyngeal Swab  Result Value Ref Range Status   SARS Coronavirus 2 NEGATIVE NEGATIVE Final    Comment: (NOTE) SARS-CoV-2 target nucleic acids are NOT DETECTED. The SARS-CoV-2 RNA is generally detectable in upper and lower respiratory specimens during the acute phase of infection. Negative results do not preclude SARS-CoV-2 infection, do not rule out co-infections with other pathogens, and should not be used as the sole basis for treatment or other patient management decisions. Negative results must be combined with clinical observations, patient history, and epidemiological information. The expected result is Negative. Fact Sheet for Patients: SugarRoll.be Fact Sheet for Healthcare Providers: https://www.woods-mathews.com/ This test is not yet approved or cleared by the [place] FDA and  has been authorized for detection and/or diagnosis of SARS-CoV-2 by FDA under an Emergency Use Authorization (EUA). This EUA will remain  in effect (meaning this test can be used) for the duration of the COVID-19 declaration under Section 56 4(b)(1) of the Act, 21 U.S.C. section 360bbb-3(b)(1), unless the authorization is terminated or revoked sooner. Performed at [facility], [place] [address], [place], [place] [address]      Studies: ECHOCARDIOGRAM COMPLETE  Result Date: 07/02/2019    ECHOCARDIOGRAM REPORT   Patient Name:   Monica Lyons Date of Exam: 07/02/2019 Medical Rec #:  [id number]              Height:       67.0 in Accession #:    [id number]            Weight:       108.9 lb Date of Birth:  [birth year]             BSA:          1.562 m Patient Age:    68 years              BP:           151/108 mmHg Patient Gender: F                     HR:           110 bpm. Exam Location:  Inpatient Procedure: 2D Echo, Cardiac Doppler and Color Doppler Indications:    R07.9* Chest pain, unspecified  History:        Patient has prior history of Echocardiogram examinations, most  recent 11/10/2017. Cardiomyopathy, Arrythmias:Atrial Fibrillation;                 Risk Factors:Hypertension. Cancer. CKD.  Sonographer:    Jonelle Sidle Dance Referring Phys: [place]  1. Left ventricular ejection fraction, by estimation, is 30 to 35%. The left ventricle has moderately decreased function. The left ventricle demonstrates global hypokinesis. There is mild left ventricular hypertrophy. Left ventricular diastolic parameters are indeterminate.  2. Right ventricular systolic function is moderately reduced. The right ventricular size is mildly enlarged. There is mildly elevated pulmonary artery systolic pressure. The estimated right ventricular systolic pressure is [id number] mmHg.  3. Left atrial size was severely dilated.  4. Right atrial size was severely dilated.  5. The mitral valve is normal in structure. Moderate mitral valve regurgitation.  6. The tricuspid valve is abnormal. Tricuspid valve regurgitation is moderate to severe.  7. The aortic valve is tricuspid. Aortic valve regurgitation is trivial. Mild aortic valve sclerosis is present, with no evidence of aortic valve stenosis.  8. The inferior vena cava is normal in size with <50% respiratory variability, suggesting right atrial pressure of 8 mmHg. FINDINGS  Left Ventricle: Left ventricular ejection fraction, by estimation, is 30 to 35%. The left ventricle has moderately decreased function. The left ventricle demonstrates global hypokinesis. The  left ventricular internal cavity size was normal in size. There is mild left ventricular hypertrophy. Left ventricular diastolic parameters are indeterminate. Right Ventricle: The right ventricular size is mildly enlarged. Right vetricular wall thickness was not assessed. Right ventricular systolic function is moderately reduced. There is mildly elevated pulmonary artery systolic pressure. The tricuspid regurgitant velocity is 2.67 m/s, and with an assumed right atrial pressure of 8 mmHg, the estimated right ventricular systolic pressure is [id number] mmHg. Left Atrium: Left atrial size was severely dilated. Right Atrium: Right atrial size was severely dilated. Pericardium: Trivial pericardial effusion is present. Mitral Valve: The mitral valve is normal in structure. Moderate mitral valve regurgitation. Tricuspid Valve: The tricuspid valve is abnormal. Tricuspid valve regurgitation is moderate to severe. Aortic Valve: The aortic valve is tricuspid. Aortic valve regurgitation is trivial. Aortic regurgitation PHT measures 271 msec. Mild aortic valve sclerosis is present, with no evidence of aortic valve stenosis. Pulmonic Valve: The pulmonic valve was not well visualized. Pulmonic valve regurgitation is trivial. Aorta: The aortic root and ascending aorta are structurally normal, with no evidence of dilitation. Venous: The inferior vena cava is normal in size with less than 50% respiratory variability, suggesting right atrial pressure of 8 mmHg. IAS/Shunts: No atrial level shunt detected by color flow Doppler. Additional Comments: There is pleural effusion in the left lateral region.  LEFT VENTRICLE PLAX 2D LVIDd:         4.70 cm LVIDs:         3.74 cm LV PW:         1.19 cm LV IVS:        0.77 cm LVOT diam:     1.80 cm LV SV:         18 LV SV Index:   12 LVOT Area:     2.54 cm  RIGHT VENTRICLE            IVC RV Basal diam:  2.88 cm    IVC diam: 2.12 cm RV S prime:     8.05 cm/s TAPSE (M-mode): 1.2 cm LEFT ATRIUM               Index  RIGHT ATRIUM           Index LA diam:        4.30 cm  2.75 cm/m  RA Area:     25.40 cm LA Vol (A2C):   152.0 ml 97.32 ml/m RA Volume:   84.20 ml  53.91 ml/m LA Vol (A4C):   86.3 ml  55.25 ml/m LA Biplane Vol: 115.0 ml 73.63 ml/m  AORTIC VALVE LVOT Vmax:   48.75 cm/s LVOT Vmean:  29.950 cm/s LVOT VTI:    0.071 m AI PHT:      271 msec  AORTA Ao Root diam: 3.30 cm Ao Asc diam:  3.20 cm MITRAL VALVE                TRICUSPID VALVE MV Area (PHT): 4.18 cm     TR Peak grad:   28.5 mmHg MV Decel Time: 182 msec     TR Vmax:        267.00 cm/s MV E velocity: 137.50 cm/s                             SHUNTS                             Systemic VTI:  0.07 m                             Systemic Diam: 1.80 cm Oswaldo Milian MD Electronically signed by Oswaldo Milian MD Signature Date/Time: 07/02/2019/8:48:06 PM    Final      Flora Lipps, MD  [facility] 07/04/2019

## 2019-07-04 NOTE — TOC Progression Note (Signed)
Transition of Care [facility]) - Progression Note    Patient Details  Name: Monica Lyons MRN: [id number] Date of Birth: Mar 26, 1933  [facility] CM/SW [place], [place] Phone Number: 07/04/2019, 2:26 PM  Clinical Narrative:     CSW met patients daughter regarding bed offers. She informed CSW patients insurance ends today and will change to [place]. SNF facilitys were updated with this information and CSW provided daughter with updated bed offer list to review. She chose [facility] in [place]. As this is close to patients home. CSW reached out to [place] with [employer]. She reports they will start insurance authorization today and states this can typically take 24 to 48 hours. Daughter updated on insurance authorization process.  Daughter reports she has healthcare POA paperwork at home and will scan and email to CSW to update files.  Pending insurance authorization at this time.    Expected Discharge Plan: [place] Barriers to Discharge: Continued Medical Work up  Expected Discharge Plan and Services Expected Discharge Plan: [place] Choice: [place] arrangements for the past 2 months: Single Family Home                                       Social Determinants of Health (SDOH) Interventions    Readmission Risk Interventions No flowsheet data found.

## 2019-07-05 LAB — BASIC METABOLIC PANEL
Anion gap: 10 (ref 5–15)
BUN: 32 mg/dL — ABNORMAL HIGH (ref 8–23)
CO2: 21 mmol/L — ABNORMAL LOW (ref 22–32)
Calcium: 8.4 mg/dL — ABNORMAL LOW (ref 8.9–10.3)
Chloride: 106 mmol/L (ref 98–111)
Creatinine, Ser: 1.43 mg/dL — ABNORMAL HIGH (ref 0.44–1.00)
GFR calc Af Amer: 39 mL/min — ABNORMAL LOW (ref >60)
GFR calc non Af Amer: 34 mL/min — ABNORMAL LOW (ref >60)
Glucose, Bld: 100 mg/dL — ABNORMAL HIGH (ref 70–99)
Potassium: 4.1 mmol/L (ref 3.5–5.1)
Sodium: 137 mmol/L (ref 135–145)

## 2019-07-05 LAB — CBC
HCT: 40.9 % (ref 36.0–46.0)
Hemoglobin: 13.5 g/dL (ref 12.0–15.0)
MCH: 29.5 pg (ref 26.0–34.0)
MCHC: 33 g/dL (ref 30.0–36.0)
MCV: 89.3 fL (ref 80.0–100.0)
Platelets: 200 10*3/uL (ref 150–400)
RBC: 4.58 MIL/uL (ref 3.87–5.11)
RDW: 14.2 % (ref 11.5–15.5)
WBC: 7.7 10*3/uL (ref 4.0–10.5)
nRBC: 0 % (ref 0.0–0.2)

## 2019-07-05 LAB — MAGNESIUM: Magnesium: 1.8 mg/dL (ref 1.7–2.4)

## 2019-07-05 MED ORDER — HALOPERIDOL LACTATE 5 MG/ML IJ SOLN
5.0000 mg | Freq: Four times a day (QID) | INTRAMUSCULAR | Status: DC | PRN
  Administered 2019-07-05: 5 mg via INTRAMUSCULAR
  Filled 2019-07-05: qty 1
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
## 2019-07-06 DIAGNOSIS — I10 Essential (primary) hypertension: Secondary | ICD-10-CM

## 2019-07-06 MED ORDER — ONDANSETRON HCL 4 MG PO TABS: 4.0000 mg | ORAL_TABLET | Freq: Four times a day (QID) | ORAL | 0 refills | Status: AC | PRN

## 2019-07-06 MED ORDER — METOPROLOL TARTRATE 25 MG PO TABS: 25.0000 mg | ORAL_TABLET | Freq: Two times a day (BID) | ORAL | Status: AC

## 2019-07-06 MED ORDER — POTASSIUM CHLORIDE ER 10 MEQ PO TBCR: 10.0000 meq | EXTENDED_RELEASE_TABLET | Freq: Every day | ORAL | Status: DC

## 2019-07-06 MED ORDER — FUROSEMIDE 40 MG PO TABS: 40.0000 mg | ORAL_TABLET | Freq: Every day | ORAL | 11 refills | Status: DC

## 2019-07-06 MED ORDER — ACETAMINOPHEN 325 MG PO TABS: 650.0000 mg | ORAL_TABLET | Freq: Four times a day (QID) | ORAL | Status: AC | PRN

## 2019-07-06 NOTE — TOC Progression Note (Addendum)
Transition of Care [facility]) - Progression Note    Patient Details  Name: Monica Lyons MRN: [id number] Date of Birth: 1933-02-28  Transition of Care [facility]) CM/SW [place], [place] Phone Number: 07/06/2019, 9:47 AM  Clinical Narrative:     Update: [facility] reports they will need 2 weeks up front of private pay while waiting on insurance auth, CSW informed [name] she is agreeable to this. She is working with [name] to arrange for time of payment.   CSW spoke with [name] regarding patient being able to go to [place] today to private pay while waiting on insurance, [name] agreeable with this plan and reports she can private pay while waiting on auth so that patient can transition to SNF.   CSW spoke with [name] they can accept today and transfer auth.   CSW has messaged MD for dc summary and orders.   Expected Discharge Plan: [place] Barriers to Discharge: Continued Medical Work up  Expected Discharge Plan and Services Expected Discharge Plan: [place] Choice: [place] arrangements for the past 2 months: Single Family Home                                       Social Determinants of Health (SDOH) Interventions    Readmission Risk Interventions No flowsheet data found.

## 2019-07-06 NOTE — Progress Notes (Signed)
Physical Therapy Treatment Patient Details Name: Monica Lyons MRN: [id number] DOB: Apr 14, 1932 Today's Date: 07/06/2019    History of Present Illness Pt is an 86 y.o. female admitted with AMS and c/o chest pain, pt found wandering by a neighbor. Head CT negative for acute abnormality. No sign of infection. Pt found to have afib with RVR. PMH includes dementia, HTN, CKD, cancer, asthma, NICM, osteopenia.    PT Comments    Patient seen for PT treatment. Pt is at high risk for falls given cognitive deficits. Current plan remains appropriate.    Follow Up Recommendations  SNF;Supervision/Assistance - 24 hour     Equipment Recommendations  None recommended by PT    Recommendations for Other Services       Precautions / Restrictions Precautions Precautions: Fall Restrictions Weight Bearing Restrictions: No    Mobility  Bed Mobility               General bed mobility comments: Received sitting in recliner  Transfers Overall transfer level: Needs assistance Equipment used: None Transfers: Sit to/from Stand Sit to Stand: Supervision            Ambulation/Gait Ambulation/Gait assistance: Min guard;Supervision Gait Distance (Feet): 300 Feet Assistive device: None Gait Pattern/deviations: Step-through pattern;Decreased stride length;Trunk flexed;Narrow base of support;Drifts right/left Gait velocity: decreased   General Gait Details: min guard for safety due to drifting R/L; pt requires max directional cues and to stay on task as she is easily distracted by environment   Stairs             Wheelchair Mobility    Modified Rankin (Stroke Patients Only)       Balance Overall balance assessment: Needs assistance   Sitting balance-Leahy Scale: Good     Standing balance support: No upper extremity supported Standing balance-Leahy Scale: Fair                              Cognition Arousal/Alertness: Awake/alert Behavior During  Therapy: WFL for tasks assessed/performed Overall Cognitive Status: History of cognitive impairments - at baseline                                 General Comments: history of dementia, oriented to self only      Exercises      General Comments General comments (skin integrity, edema, etc.): once back in room attempted to put posey belt back on pt however she refused and then refused to sit back down so RN notified       Pertinent Vitals/Pain Pain Assessment: No/denies pain    Home Living                      Prior Function            PT Goals (current goals can now be found in the care plan section) Progress towards PT goals: Progressing toward goals    Frequency    Min 2X/week      PT Plan Current plan remains appropriate    Co-evaluation              AM-PAC PT "6 Clicks" Mobility   Outcome Measure  Help needed turning from your back to your side while in a flat bed without using bedrails?: None Help needed moving from lying on your back to sitting on the side of a  flat bed without using bedrails?: None Help needed moving to and from a bed to a chair (including a wheelchair)?: None Help needed standing up from a chair using your arms (e.g., wheelchair or bedside chair)?: None Help needed to walk in hospital room?: A Little Help needed climbing 3-5 steps with a railing? : A Little 6 Click Score: 22    End of Session   Activity Tolerance: Patient tolerated treatment well Patient left: Other (comment)(ambulating with RN in hallway ) Nurse Communication: Mobility status PT Visit Diagnosis: Other abnormalities of gait and mobility (R26.89);Muscle weakness (generalized) (M62.81)     Time: 0930-1005 PT Time Calculation (min) (ACUTE ONLY): 35 min  Charges:  $Gait Training: 8-22 mins $Therapeutic Activity: 8-22 mins                     .krt   Darliss Cheney 07/06/2019, 10:19 AM

## 2019-07-06 NOTE — TOC Transition Note (Signed)
Transition of Care [facility]) - CM/SW Discharge Note   Patient Details  Name: Monica Lyons MRN: [id number] Date of Birth: Apr 16, 1932  [facility]) CM/SW Contact:  Alberteen Sam, LCSW Phone Number: 07/06/2019, 9:51 AM   Clinical Narrative:     Patient will DC to: [facility] Anticipated DC date: 07/06/19 Family notified: Monica Lyons Transport by: Corey Harold  Per MD patient ready for DC to [place] . RN, patient, patient's family, and facility notified of DC. Discharge Summary sent to facility. RN given number for report  [phone]  . DC packet on chart. Ambulance transport requested for patient.  CSW signing off.  [place], [place]   Final next level of care: Skilled Nursing Facility Barriers to Discharge: No Barriers Identified   Patient Goals and CMS Choice   CMS Medicare.gov Compare Post Acute Care list provided to:: Patient Represenative (must comment)(daughter Monica Lyons) Choice offered to / list presented to : Adult Children  Discharge Placement PASRR number recieved: 07/03/19            Patient chooses bed at: [facility] Patient to be transferred to facility by: [facility] Name of family member notified: Ann Patient and family notified of of transfer: 07/06/19  Discharge Plan and Services     Post Acute Care Choice: [place]                               Social Determinants of Health (SDOH) Interventions     Readmission Risk Interventions No flowsheet data found.

## 2019-07-06 NOTE — Progress Notes (Signed)
Monica Lyons Patient very confused and agitated.Patient does not believe that she is in the hospital.Attempt to reorient patient with no success .Patient up out of bed attempting to leave room. Asked patient where she is going Patient stated," I am going home."Expalined to patient that she is unable to leave the hospital right now.Patient started yelling,"Help me help me I'm being kidnapped."This nurse called patient daughter Monica Lyons and asked her to come sit with her mother.Mrs Monica Lyons stated ,"Due to health reasons she can't come to hospital."Charge Nurse Monica Lyons notified of patient confusion and patient trying to leave hospital.Charge nurse Monica Lyons spoke with daughter Monica Lyons.Text paged NP Arby Barrette. Orders received for haldol and waist belt restraint .Patient daughter informed of this. 2015 Haldol given.2100 Patient remains confused and trying to wander out of room to leave the hospital.Patient stating," I am leaving now.You can't keep me here.My car is in the parking lot."Patient assisted to bed by nursing staff and waist belt restraint applied.
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
## 2019-07-06 NOTE — Discharge Instructions (Signed)
Information on my medicine - ELIQUIS (apixaban)  This medication education was reviewed with me or my healthcare representative as part of my discharge preparation.  The pharmacist that spoke with me during my hospital stay was:  Monica Lyons L Sephira Zellman, RPH  Why was Eliquis prescribed for you? Eliquis was prescribed for you to reduce the risk of forming blood clots that can cause a stroke if you have a medical condition called atrial fibrillation (a type of irregular heartbeat) OR to reduce the risk of a blood clots forming after orthopedic surgery.  What do You need to know about Eliquis ? Take your Eliquis TWICE DAILY - one tablet in the morning and one tablet in the evening with or without food.  It would be best to take the doses about the same time each day.  If you have difficulty swallowing the tablet whole please discuss with your pharmacist how to take the medication safely.  Take Eliquis exactly as prescribed by your doctor and DO NOT stop taking Eliquis without talking to the doctor who prescribed the medication.  Stopping may increase your risk of developing a new clot or stroke.  Refill your prescription before you run out.  After discharge, you should have regular check-up appointments with your healthcare provider that is prescribing your Eliquis.  In the future your dose may need to be changed if your kidney function or weight changes by a significant amount or as you get older.  What do you do if you miss a dose? If you miss a dose, take it as soon as you remember on the same day and resume taking twice daily.  Do not take more than one dose of ELIQUIS at the same time.  Important Safety Information A possible side effect of Eliquis is bleeding. You should call your healthcare provider right away if you experience any of the following: ? Bleeding from an injury or your nose that does not stop. ? Unusual colored urine (red or dark brown) or unusual colored stools (red or  black). ? Unusual bruising for unknown reasons. ? A serious fall or if you hit your head (even if there is no bleeding).  Some medicines may interact with Eliquis and might increase your risk of bleeding or clotting while on Eliquis. To help avoid this, consult your healthcare provider or pharmacist prior to using any new prescription or non-prescription medications, including herbals, vitamins, non-steroidal anti-inflammatory drugs (NSAIDs) and supplements.  This website has more information on Eliquis (apixaban): www.Eliquis.com.    

## 2019-07-09 ENCOUNTER — Encounter: Payer: Self-pay | Admitting: Adult Health

## 2019-07-09 ENCOUNTER — Non-Acute Institutional Stay (SKILLED_NURSING_FACILITY): Payer: Medicare Other | Admitting: Adult Health

## 2019-07-09 DIAGNOSIS — F0390 Unspecified dementia without behavioral disturbance: Secondary | ICD-10-CM | POA: Diagnosis not present

## 2019-07-09 DIAGNOSIS — I5023 Acute on chronic systolic (congestive) heart failure: Secondary | ICD-10-CM

## 2019-07-09 DIAGNOSIS — I4891 Unspecified atrial fibrillation: Secondary | ICD-10-CM | POA: Diagnosis not present

## 2019-07-09 DIAGNOSIS — N1832 Chronic kidney disease, stage 3b: Secondary | ICD-10-CM | POA: Diagnosis not present
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
## 2019-07-10 ENCOUNTER — Encounter: Payer: Self-pay | Admitting: Internal Medicine

## 2019-07-10 ENCOUNTER — Non-Acute Institutional Stay (SKILLED_NURSING_FACILITY): Payer: Medicare Other | Admitting: Internal Medicine

## 2019-07-10 DIAGNOSIS — I5041 Acute combined systolic (congestive) and diastolic (congestive) heart failure: Secondary | ICD-10-CM

## 2019-07-10 DIAGNOSIS — I480 Paroxysmal atrial fibrillation: Secondary | ICD-10-CM | POA: Diagnosis not present

## 2019-07-10 DIAGNOSIS — F0390 Unspecified dementia without behavioral disturbance: Secondary | ICD-10-CM | POA: Diagnosis not present

## 2019-07-10 NOTE — Assessment & Plan Note (Addendum)
Son plans to move her to assisted living facility in [place] , [place] where he resides. Once in a supervised environment; need for memory unit can be assessed
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
## 2019-07-10 NOTE — Assessment & Plan Note (Signed)
07/10/2019 1.5+ pitting edema in the context of nonadherence with leg elevation. Sodium restricted diet and Lasix as needed.

## 2019-07-11 NOTE — Assessment & Plan Note (Signed)
Rhythm slightly irregular; rate controlled

## 2019-07-11 NOTE — Patient Instructions (Signed)
See assessment and plan under each diagnosis in the problem list and acutely for this visit 

## 2019-07-12 ENCOUNTER — Non-Acute Institutional Stay (SKILLED_NURSING_FACILITY): Payer: Medicare Other | Admitting: Adult Health

## 2019-07-12 ENCOUNTER — Encounter: Payer: Self-pay | Admitting: Adult Health

## 2019-07-12 DIAGNOSIS — F0390 Unspecified dementia without behavioral disturbance: Secondary | ICD-10-CM

## 2019-07-12 DIAGNOSIS — N1832 Chronic kidney disease, stage 3b: Secondary | ICD-10-CM

## 2019-07-12 DIAGNOSIS — I5023 Acute on chronic systolic (congestive) heart failure: Secondary | ICD-10-CM

## 2019-07-12 DIAGNOSIS — I4891 Unspecified atrial fibrillation: Secondary | ICD-10-CM | POA: Diagnosis not present

## 2019-07-12 MED ORDER — FUROSEMIDE 20 MG PO TABS: 20.0000 mg | ORAL_TABLET | Freq: Every day | ORAL | 0 refills | Status: AC

## 2019-07-12 MED ORDER — POTASSIUM CHLORIDE ER 10 MEQ PO TBCR: 10.0000 meq | EXTENDED_RELEASE_TABLET | Freq: Every day | ORAL | 0 refills | Status: AC
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
## 2019-07-13 ENCOUNTER — Ambulatory Visit: Payer: PRIVATE HEALTH INSURANCE | Admitting: Cardiovascular Disease

## 2020-03-05 DEATH — deceased
